# Patient Record
Sex: Female | Born: 2005 | Race: Black or African American | Hispanic: No | Marital: Single | Smoking: Never smoker
Health system: Southern US, Community
[De-identification: ages and names within clinical notes are randomized; demographics above are authoritative.]

## PROBLEM LIST (undated history)

## (undated) DIAGNOSIS — F32A Depression, unspecified: Secondary | ICD-10-CM

## (undated) DIAGNOSIS — F1910 Other psychoactive substance abuse, uncomplicated: Secondary | ICD-10-CM

---
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
## 2023-05-06 ENCOUNTER — Ambulatory Visit: Payer: Medicaid Other

## 2023-06-18 ENCOUNTER — Other Ambulatory Visit: Payer: Self-pay

## 2023-06-18 ENCOUNTER — Emergency Department
Admission: EM | Admit: 2023-06-18 | Discharge: 2023-06-19 | Disposition: A | Discharge status: Other Acute Inpatient Hospital | Payer: Medicaid Other | Attending: Emergency Medicine | Admitting: Emergency Medicine

## 2023-06-18 DIAGNOSIS — F332 Major depressive disorder, recurrent severe without psychotic features: Secondary | ICD-10-CM | POA: Insufficient documentation

## 2023-06-18 DIAGNOSIS — F32A Depression, unspecified: Secondary | ICD-10-CM

## 2023-06-18 DIAGNOSIS — R45851 Suicidal ideations: Secondary | ICD-10-CM | POA: Insufficient documentation

## 2023-06-18 LAB — COMPREHENSIVE METABOLIC PANEL
ALT: 14 U/L (ref 0–44)
AST: 25 U/L (ref 15–41)
Albumin: 4.8 g/dL (ref 3.5–5.0)
Alkaline Phosphatase: 56 U/L (ref 38–126)
Anion gap: 12 (ref 5–15)
BUN: 14 mg/dL (ref 6–20)
CO2: 23 mmol/L (ref 22–32)
Calcium: 9.5 mg/dL (ref 8.9–10.3)
Chloride: 103 mmol/L (ref 98–111)
Creatinine, Ser: 0.8 mg/dL (ref 0.44–1.00)
GFR, Estimated: >60 mL/min (ref >60)
Glucose, Bld: 77 mg/dL (ref 70–99)
Potassium: 3.4 mmol/L — ABNORMAL LOW (ref 3.5–5.1)
Sodium: 138 mmol/L (ref 135–145)
Total Bilirubin: 0.9 mg/dL (ref 0.0–1.2)
Total Protein: 8.2 g/dL — ABNORMAL HIGH (ref 6.5–8.1)

## 2023-06-18 LAB — CBC
HCT: 37.3 % (ref 36.0–46.0)
Hemoglobin: 13.1 g/dL (ref 12.0–15.0)
MCH: 29.6 pg (ref 26.0–34.0)
MCHC: 35.1 g/dL (ref 30.0–36.0)
MCV: 84.4 fL (ref 80.0–100.0)
Platelets: 272 10*3/uL (ref 150–400)
RBC: 4.42 MIL/uL (ref 3.87–5.11)
RDW: 11.9 % (ref 11.5–15.5)
WBC: 6 10*3/uL (ref 4.0–10.5)
nRBC: 0 % (ref 0.0–0.2)

## 2023-06-18 LAB — SALICYLATE LEVEL: Salicylate Lvl: <7 mg/dL — ABNORMAL LOW (ref 7.0–30.0)

## 2023-06-18 LAB — ETHANOL: Alcohol, Ethyl (B): <10 mg/dL (ref <10)

## 2023-06-18 LAB — ACETAMINOPHEN LEVEL: Acetaminophen (Tylenol), Serum: <10 ug/mL — ABNORMAL LOW (ref 10–30)

## 2023-06-18 NOTE — Consult Note (Incomplete)
 [facility] Psychiatric Consult Initial  Patient Name: .Gabrielle Rich  MRN: [id number]  DOB: May 19, 2005  Consult Order details:  Orders (From admission, onward)     Start     Ordered   06/18/23 2216  CONSULT TO CALL ACT TEAM       Ordering Provider: Sharman Cheek, MD  Provider:  (Not yet assigned)  Question:  Reason for Consult?  Answer:  IVC   06/18/23 2215             Mode of Visit: Tele-visit Virtual Statement:TELE PSYCHIATRY ATTESTATION & CONSENT As the provider for this telehealth consult, I attest that I verified the patient's identity using two separate identifiers, introduced myself to the patient, provided my credentials, disclosed my location, and performed this encounter via a HIPAA-compliant, real-time, face-to-face, two-way, interactive audio and video platform and with the full consent and agreement of the patient (or guardian as applicable.) Patient physical location: [facility]. Telehealth provider physical location: home office in state of [place].   Video start time:   Video end time:      Psychiatry Consult Evaluation  Service Date: June 18, 2023 LOS:  LOS: 0 days  Chief Complaint Evaluation for suicidality and acute psychiatric stabilization.  Primary Psychiatric Diagnoses  Suicidal ideation MDD (major depressive disorder), recurrent episode, severe (HCC) Assessment  Makalia Allien Melberg is a 18 y.o. female admitted: Presented to the ED on 06/18/2023 10:32 PM for depression and a suicide attempt. She carries the psychiatric diagnoses of MDD and PTSD.    Her current presentation of *** is most consistent with ***. She meets criteria for *** based on ***.  Current outpatient psychotropic medications include *** and historically she has had a *** response to these medications. She was *** compliant with medications prior to admission as evidenced by ***. On initial examination, patient ***. Please see plan below for detailed recommendations.   Diagnoses:  Active  Hospital problems: Principal Problem:   Suicidal ideation Active Problems:   MDD (major depressive disorder), recurrent episode, severe (HCC)    Plan   ## Psychiatric Medication Recommendations:  Will defer to inpatient psychiatrist   ## Medical Decision Making Capacity: {CHL BH MEDICAL DECISION MAKING CAPACITY:31818}  ## Further Work-up:  -- *** {CHLmacgeneralandspecificworkuprecs:31821} -- most recent EKG on *** had QtC of *** -- Pertinent labwork reviewed earlier this admission includes: ***   ## Disposition:-- {CHLmaccldispo:31820}  ## Behavioral / Environmental: -{CHLmacbehavioralenvironmental2:31847}    ## Safety and Observation Level:  - Based on my clinical evaluation, I estimate the patient to be at *** risk of self harm in the current setting. - At this time, we recommend  {CHL BH SUICIDE OBSERVATION LEVEL:31850}. This decision is based on my review of the chart including patient's history and current presentation, interview of the patient, mental status examination, and consideration of suicide risk including evaluating suicidal ideation, plan, intent, suicidal or self-harm behaviors, risk factors, and protective factors. This judgment is based on our ability to directly address suicide risk, implement suicide prevention strategies, and develop a safety plan while the patient is in the clinical setting. Please contact our team if there is a concern that risk level has changed.  CSSR Risk Category:C-SSRS RISK CATEGORY: No Risk  Suicide Risk Assessment: Patient has following modifiable risk factors for suicide: {CHLmacmodifiablesuicideriskfactors:31822}, which we are addressing by ***. Patient has following non-modifiable or demographic risk factors for suicide: {CHLmacnonmodifiablesuicideriskfactors:31823} Patient has the following protective factors against suicide: {CHLmacprotectivefactors:31824}  Thank you for this consult  request. Recommendations have been  communicated to the primary team.  We will *** at this time.   Jearld Lesch, NP       History of Present Illness  Relevant Aspects of Hospital [facility] Hospital or ED course:31819} Course:  Admitted on 06/18/2023 for ***. They ***.   Patient Report:  Patient states "they bought me here". Patient states that she sat on the train tracks today. Patient states she has been feeling suicidal since middle school. She says she has attempted SA yesterday where she took 10 zanax, but when that didn't work, she decided to lay on the train tracks today.  She say a man came She feels that her mother doesn't care about her.    Psych ROS:  Depression: *** Anxiety:  *** Mania (lifetime and current): *** Psychosis: (lifetime and current): ***  Collateral information:  Contacted *** at *** on ***  ROS   Psychiatric and Social History  Psychiatric History:  Information collected from ***  Prev Dx/Sx: *** Current Psych Provider: *** Home Meds (current): *** Previous Med Trials: *** Therapy: ***  Prior Psych Hospitalization: ***  Prior Self Harm: *** Prior Violence: ***  Family Psych History: *** Family Hx suicide: ***  Social History:  Developmental Hx: *** Educational Hx: *** Occupational Hx: *** Legal Hx: *** Living Situation: *** Spiritual Hx: *** Access to weapons/lethal means: ***   Substance History Alcohol: ***  Type of alcohol *** Last Drink *** Number of drinks per day *** History of alcohol withdrawal seizures *** History of DT's *** Tobacco: *** Illicit drugs: *** Prescription drug abuse: *** Rehab hx: ***  Exam Findings  Physical Exam: *** Vital Signs:  Temp:  [98 F (36.7 C)] 98 F (36.7 C) (02/22 2216) Pulse Rate:  [75] 75 (02/22 2216) Resp:  [14] 14 (02/22 2216) BP: (119)/(88) 119/88 (02/22 2216) SpO2:  [98 %] 98 % (02/22 2216) Blood pressure 119/88, pulse 75, temperature 98 F (36.7 C), temperature source Oral, resp. rate 14, height 5\' 3"  (1.6 m),  last menstrual period 10/26/2022, SpO2 98%, unknown if currently breastfeeding. Body mass index is 17.36 kg/m.  Physical Exam  Mental Status Exam: General Appearance: {Appearance:22683}  Orientation:  {BHH ORIENTATION (PAA):22689}  Memory:  {BHH MEMORY:22881}  Concentration:  {Concentration:21399}  Recall:  {BHH GOOD/FAIR/POOR:22877}  Attention  {BH Attention Span:31825}  Eye Contact:  {BHH EYE CONTACT:22684}  Speech:  {Speech:22685}  Language:  {BHH GOOD/FAIR/POOR:22877}  Volume:  {Volume (PAA):22686}  Mood: ***  Affect:  {Affect (PAA):22687}  Thought Process:  {Thought Process (PAA):22688}  Thought Content:  {Thought Content:22690}  Suicidal Thoughts:  {ST/HT (PAA):22692}  Homicidal Thoughts:  {ST/HT (PAA):22692}  Judgement:  {Judgement (PAA):22694}  Insight:  {Insight (PAA):22695}  Psychomotor Activity:  {Psychomotor (PAA):22696}  Akathisia:  {BHH YES OR NO:22294}  Fund of Knowledge:  {BHH GOOD/FAIR/POOR:22877}      Assets:  {Assets (PAA):22698}  Cognition:  {chl bhh cognition:304700322}  ADL's:  {BHH ZOX'W:96045}  AIMS (if indicated):        Other History   These have been pulled in through the EMR, reviewed, and updated if appropriate.  Family History:  The patient's family history is not on file.  Medical History: No past medical history on file.  Surgical History: Past Surgical History:  Procedure Laterality Date  . LAPAROSCOPIC APPENDECTOMY N/A 11/15/2020   Procedure: APPENDECTOMY LAPAROSCOPIC;  Surgeon: Leonia Corona, MD;  Location: [facility] OR;  Service: Pediatrics;  Laterality: N/A;     Medications:  No current  facility-administered medications for this encounter.  Current Outpatient Medications:  .  medroxyPROGESTERone (DEPO-PROVERA) 150 MG/ML injection, Inject 150 mg into the muscle every 3 (three) months., Disp: , Rfl:  .  acetaminophen (TYLENOL) 500 MG tablet, Take 1 tablet (500 mg total) by mouth every 6 (six) hours as needed for mild pain or  moderate pain (>101.5 F). (Patient not taking: Reported on 06/18/2023), Disp: 30 tablet, Rfl: 0 .  ibuprofen (ADVIL) 200 MG tablet, Take 1 tablet (200 mg total) by mouth every 6 (six) hours as needed for mild pain or moderate pain. (Patient not taking: Reported on 06/18/2023), Disp: 30 tablet, Rfl: 0  Allergies: No Known Allergies  Romar Woodrick Damaris Hippo, NP

## 2023-06-18 NOTE — Consult Note (Addendum)
 [facility] Psychiatric Consult Initial  Patient Name: .Gabrielle Rich  MRN: [id number]  DOB: November 26, 2005  Consult Order details:  Orders (From admission, onward)     Start     Ordered   06/18/23 2216  CONSULT TO CALL ACT TEAM       Ordering Provider: Sharman Cheek, MD  Provider:  (Not yet assigned)  Question:  Reason for Consult?  Answer:  IVC   06/18/23 2215             Mode of Visit: Tele-visit Virtual Statement:TELE PSYCHIATRY ATTESTATION & CONSENT As the provider for this telehealth consult, I attest that I verified the patient's identity using two separate identifiers, introduced myself to the patient, provided my credentials, disclosed my location, and performed this encounter via a HIPAA-compliant, real-time, face-to-face, two-way, interactive audio and video platform and with the full consent and agreement of the patient (or guardian as applicable.) Patient physical location: [facility] ER. Telehealth provider physical location: home office in state of [place].   Video start time:   Video end time:      Psychiatry Consult Evaluation  Service Date: June 18, 2023 LOS:  LOS: 0 days  Chief Complaint Evaluation for suicidality and acute psychiatric stabilization.  Primary Psychiatric Diagnoses  Suicidal ideation MDD (major depressive disorder), recurrent episode, severe (HCC) Assessment  The patient presents with acute suicidality in the context of severe depression and significant psychosocial stressors. Her presentation is most consistent with a severe major depressive episode with high suicide risk. She meets criteria for inpatient psychiatric admission based on persistent suicidal ideation, recent suicide attempt, and lack of adequate social support.  Diagnoses:  Active Hospital problems: Principal Problem:   Suicidal ideation Active Problems:   MDD (major depressive disorder), recurrent episode, severe (HCC)    Plan   ## Psychiatric Medication Recommendations:   Will defer to inpatient psychiatrist   ## Medical Decision Making Capacity: Not specifically addressed in this encounter  ## Further Work-up:   Kassity Woodson was admitted to [facility] ER Suicidal ideation, crisis management, and stabilization. Routine labs ordered, which include  Lab Orders         Comprehensive metabolic panel         Ethanol         Salicylate level         Acetaminophen level         cbc         Urine Drug Screen, Qualitative         POC urine preg, ED    Medication Management: Medications started  Will maintain observation checks every 15 minutes for safety. Psychosocial education regarding relapse prevention and self-care; social and communication  Social work will consult with family for collateral information and discuss discharge and follow up plan.  ## Disposition:-- We recommend inpatient psychiatric hospitalization when medically cleared. Patient is under voluntary admission status at this time; please IVC if attempts to leave hospital.  ## Behavioral / Environmental: - No specific recommendations at this time.     ## Safety and Observation Level:  - Based on my clinical evaluation, I estimate the patient to be at moderate  risk of self harm in the current setting. - At this time, we recommend  routine. This decision is based on my review of the chart including patient's history and current presentation, interview of the patient, mental status examination, and consideration of suicide risk including evaluating suicidal ideation, plan, intent, suicidal or self-harm behaviors, risk factors,  and protective factors. This judgment is based on our ability to directly address suicide risk, implement suicide prevention strategies, and develop a safety plan while the patient is in the clinical setting. Please contact our team if there is a concern that risk level has changed.  CSSR Risk Category:C-SSRS RISK CATEGORY: No Risk  Suicide Risk Assessment: Patient  has following modifiable risk factors for suicide: active suicidal ideation, untreated depression, and recklessness, which we are addressing by recommending hospitalization. Patient has following non-modifiable or demographic risk factors for suicide: history of suicide attempt and history of self harm behavior Patient has the following protective factors against suicide: Minor children in the home  Thank you for this consult request. Recommendations have been communicated to the primary team.  We will recommend inpatient hospitalization at this time.   Jearld Lesch, NP       History of Present Illness  Relevant Aspects of Hospital ED Course:  Admitted on 06/18/2023 for Suicide attempt and MDD.   Gabrielle Rich is a 18 year old female who presented to the Emergency Department with symptoms of depression and suicidality following a suicide attempt. She has a known psychiatric history of Major Depressive Disorder (MDD) and Post-Traumatic Stress Disorder (PTSD). The patient reports chronic suicidal ideation since middle school.  On 06/17/2023, she attempted to overdose by ingesting 10 Xanax tablets. When this attempt failed, she proceeded to lie on train tracks with the intention of being struck by a train. A passerby intervened, convincing her to leave the tracks moments before an oncoming train arrived. The patient expressed regret that she was saved.  She reports recent stressors, including a transition in living arrangements. She was living with her grandmother until one month ago and has since moved in with her mother. She describes her relationship with her mother as strained, stating that when she disclosed her suicidal thoughts, her mother responded, "That's on you."  Psych ROS:  Depression: current Anxiety:  current Mania (lifetime and current): unknown Psychosis: (lifetime and current): unknown   Review of Systems  Psychiatric/Behavioral:  Positive for depression and suicidal  ideas. Negative for hallucinations and substance abuse. The patient is nervous/anxious and has insomnia.   All other systems reviewed and are negative.    Psychiatric and Social History  Psychiatric History:  Diagnoses: Major Depressive Disorder (MDD), Post-Traumatic Stress Disorder (PTSD)  Prior Suicide Attempts: Yes, multiple, including the most recent attempt.  Previous Psychiatric Hospitalizations: Unknown  Outpatient Psychiatric Care: Unknown  History of Self-Injurious Behavior: Unknown    Exam Findings  Physical Exam:  Vital Signs:  Temp:  [98 F (36.7 C)] 98 F (36.7 C) (02/22 2216) Pulse Rate:  [75] 75 (02/22 2216) Resp:  [14] 14 (02/22 2216) BP: (119)/(88) 119/88 (02/22 2216) SpO2:  [98 %] 98 % (02/22 2216) Blood pressure 119/88, pulse 75, temperature 98 F (36.7 C), temperature source Oral, resp. rate 14, height 5\' 3"  (1.6 m), last menstrual period 10/26/2022, SpO2 98%, unknown if currently breastfeeding. Body mass index is 17.36 kg/m.  Physical Exam Vitals and nursing note reviewed.  HENT:     Head: Normocephalic and atraumatic.     Nose: Nose normal.     Mouth/Throat:     Mouth: Mucous membranes are dry.  Eyes:     Pupils: Pupils are equal, round, and reactive to light.  Pulmonary:     Effort: Pulmonary effort is normal.  Musculoskeletal:        General: Normal range of motion.  Cervical back: Normal range of motion.  Skin:    General: Skin is dry.  Neurological:     Mental Status: She is alert and oriented to person, place, and time.  Psychiatric:        Attention and Perception: Attention and perception normal.        Mood and Affect: Mood is depressed. Affect is flat and tearful.        Speech: Speech normal.        Behavior: Behavior normal. Behavior is cooperative.        Thought Content: Thought content includes suicidal ideation. Thought content includes suicidal plan.        Cognition and Memory: Cognition and memory normal.         Judgment: Judgment is impulsive.     Mental Status Exam: General Appearance: Casual  Orientation:  Full (Time, Place, and Person)  Memory:  Immediate;   Fair Recent;   Fair Remote;   Fair  Concentration:  Concentration: Fair and Attention Span: Fair  Recall:  Fair  Attention  Fair  Eye Contact:  Good  Speech:  Normal Rate  Language:  Fair  Volume:  Decreased  Mood: depressed  Affect:  Congruent  Thought Process:  Coherent  Thought Content:  WDL  Suicidal Thoughts:  Yes.  with intent/plan  Homicidal Thoughts:  No  Judgement:  Poor  Insight:  Lacking  Psychomotor Activity:  Normal  Akathisia:  NA  Fund of Knowledge:  Fair      Assets:  Engineer, maintenance Physical Health  Cognition:  WNL  ADL's:  Intact  AIMS (if indicated):        Other History   These have been pulled in through the EMR, reviewed, and updated if appropriate.  Family History:  The patient's family history is not on file.  Medical History: No past medical history on file.  Surgical History: Past Surgical History:  Procedure Laterality Date   LAPAROSCOPIC APPENDECTOMY N/A 11/15/2020   Procedure: APPENDECTOMY LAPAROSCOPIC;  Surgeon: Leonia Corona, MD;  Location: [facility] OR;  Service: Pediatrics;  Laterality: N/A;     Medications:  No current facility-administered medications for this encounter.  Current Outpatient Medications:    medroxyPROGESTERone (DEPO-PROVERA) 150 MG/ML injection, Inject 150 mg into the muscle every 3 (three) months., Disp: , Rfl:    acetaminophen (TYLENOL) 500 MG tablet, Take 1 tablet (500 mg total) by mouth every 6 (six) hours as needed for mild pain or moderate pain (>101.5 F). (Patient not taking: Reported on 06/18/2023), Disp: 30 tablet, Rfl: 0   ibuprofen (ADVIL) 200 MG tablet, Take 1 tablet (200 mg total) by mouth every 6 (six) hours as needed for mild pain or moderate pain. (Patient not taking: Reported on 06/18/2023), Disp: 30 tablet, Rfl: 0  Allergies: No  Known Allergies  Khoen Genet Damaris Hippo, NP

## 2023-06-18 NOTE — BH Assessment (Addendum)
 Comprehensive Clinical Assessment (CCA) Note  06/18/2023 Gabrielle Rich [id number] Recommendations for Services/Supports/Treatments: Psych NP Rashaun D. determined pt. meets psychiatric inpatient criteria. Gabrielle Rich is a 18 year old, English speaking, Black female with an unknown psych hx. Per triage note: Pt to ed from home via [employer] under IVC for SI. Pt allegedly took 10 Xanax last night in attempts to kill herself and when that didn't work she wanted to go lay across the railroad tracks. A stranger called 911 when they saw her lying on the train tracks earlier today. Pt was taken to [facility] for treatment but they decided to send her here instead of keeping here there.  Pt presented with, "It's really in my head" when asked what brought her to the hospital. Upon assessment, pt. was initially guarded. Pt eventually endorsed feeling depressed, hopeless, and stressed. Pt reported that she'd ingested 10 Xanax pills she'd gotten from a friend 06/17/23 due to no longer wanting to live. Pt reported that she'd tried to lay on the train tracks today due to the OD attempt being unsuccessful. Pt admitted to suicidal intent or actually wanting to die, explaining that she was disappointed that a good Samaritan saw her on the tracks. Pt admitted that her main stressor is feeling that her mother picks her boyfriend over her. Pt reported that she recently moved with her mother and boyfriend about 1 month ago. Pt explained that he mother nurtures her 3 younger brothers; however, she does not seem to care about her. Pt reported that she'd gotten overwhelmed earlier due to witnessing an argument between her mother and her boyfriend. Pt reported that she has always been in her grandmother's care during her formative years. Pt reported that her mother is a substance abuser. Pt became tearful while providing her history. Pt reported having sleep and appetite disturbance. Pt explained that she does not receive any services. The pt. had  lacking insight and dangerous judgement. Pt did not appear to be responding to internal or external stimuli. Pt presented with soft speech and had linear thought processes. Pt avoided eye contact. Pt presented with a depressed mood; affect was tearful. Pt denied current HI/AV/H.   Risk Factors: Current ideation Impulsivity, agitation,  poor self-control, hopelessness, and previous suicide ideation or attempts  Protective Factors: N/A   Chief Complaint:  Chief Complaint  Patient presents with   Psychiatric Evaluation   Visit Diagnosis: MDD    CCA Screening, Triage and Referral (STR)  Patient Reported Information How did you hear about [place]? No data recorded Referral name: No data recorded Referral phone number: No data recorded  Whom do you see for routine medical problems? No data recorded Practice/Facility Name: No data recorded Practice/Facility Phone Number: No data recorded Name of Contact: No data recorded Contact Number: No data recorded Contact Fax Number: No data recorded Prescriber Name: No data recorded Prescriber Address (if known): No data recorded  What Is the Reason for Your Visit/Call Today? No data recorded How Long Has This Been Causing You Problems? No data recorded What Do You Feel Would Help You the Most Today? No data recorded  Have You Recently Been in Any Inpatient Treatment (Hospital/Detox/Crisis Center/28-Day Program)? No data recorded Name/Location of Program/Hospital:No data recorded How Long Were You There? No data recorded When Were You Discharged? No data recorded  Have You Ever Received Services From [facility] Before? No data recorded Who Do You See at [facility]? No data recorded  Have You Recently Had Any Thoughts About Hurting Yourself?  No data recorded Are You Planning to Commit Suicide/Harm Yourself At This time? No data recorded  Have you Recently Had Thoughts About Hurting Someone Karolee Ohs? No data recorded Explanation: No data  recorded  Have You Used Any Alcohol or Drugs in the Past 24 Hours? No data recorded How Long Ago Did You Use Drugs or Alcohol? No data recorded What Did You Use and How Much? No data recorded  Do You Currently Have a Therapist/Psychiatrist? No data recorded Name of Therapist/Psychiatrist: No data recorded  Have You Been Recently Discharged From Any Office Practice or Programs? No data recorded Explanation of Discharge From Practice/Program: No data recorded    CCA Screening Triage Referral Assessment Type of Contact: No data recorded Is this Initial or Reassessment? No data recorded Date Telepsych consult ordered in [facility]:  No data recorded Time Telepsych consult ordered in [facility]:  No data recorded  Patient Reported Information Reviewed? No data recorded Patient Left Without Being Seen? No data recorded Reason for Not Completing Assessment: No data recorded  Collateral Involvement: No data recorded  Does Patient Have a Court Appointed Legal Guardian? No data recorded Name and Contact of Legal Guardian: No data recorded If Minor and Not Living with Parent(s), Who has Custody? No data recorded Is CPS involved or ever been involved? No data recorded Is APS involved or ever been involved? No data recorded  Patient Determined To Be At Risk for Harm To Self or Others Based on Review of Patient Reported Information or Presenting Complaint? No data recorded Method: No data recorded Availability of Means: No data recorded Intent: No data recorded Notification Required: No data recorded Additional Information for Danger to Others Potential: No data recorded Additional Comments for Danger to Others Potential: No data recorded Are There Guns or Other Weapons in Your Home? No data recorded Types of Guns/Weapons: No data recorded Are These Weapons Safely Secured?                            No data recorded Who Could Verify You Are Able To Have These Secured: No data recorded Do You Have any  Outstanding Charges, Pending Court Dates, Parole/Probation? No data recorded Contacted To Inform of Risk of Harm To Self or Others: No data recorded  Location of Assessment: No data recorded  Does Patient Present under Involuntary Commitment? No data recorded IVC Papers Initial File Date: No data recorded  [place] of Residence: No data recorded  Patient Currently Receiving the Following Services: No data recorded  Determination of Need: No data recorded  Options For Referral: No data recorded    CCA Biopsychosocial Intake/Chief Complaint:  No data recorded Current Symptoms/Problems: No data recorded  Patient Reported Schizophrenia/Schizoaffective Diagnosis in Past: No data recorded  Strengths: No data recorded Preferences: No data recorded Abilities: No data recorded  Type of Services Patient Feels are Needed: No data recorded  Initial Clinical Notes/Concerns: No data recorded  Mental Health Symptoms Depression:  No data recorded  Duration of Depressive symptoms: No data recorded  Mania:  No data recorded  Anxiety:   No data recorded  Psychosis:  No data recorded  Duration of Psychotic symptoms: No data recorded  Trauma:  No data recorded  Obsessions:  No data recorded  Compulsions:  No data recorded  Inattention:  No data recorded  Hyperactivity/Impulsivity:  No data recorded  Oppositional/Defiant Behaviors:  No data recorded  Emotional Irregularity:  No data recorded  Other Mood/Personality  Symptoms:  No data recorded   Mental Status Exam Appearance and self-care  Stature:  No data recorded  Weight:  No data recorded  Clothing:  No data recorded  Grooming:  No data recorded  Cosmetic use:  No data recorded  Posture/gait:  No data recorded  Motor activity:  No data recorded  Sensorium  Attention:  No data recorded  Concentration:  No data recorded  Orientation:  No data recorded  Recall/memory:  No data recorded  Affect and Mood  Affect:  No data  recorded  Mood:  No data recorded  Relating  Eye contact:  No data recorded  Facial expression:  No data recorded  Attitude toward examiner:  No data recorded  Thought and Language  Speech flow: No data recorded  Thought content:  No data recorded  Preoccupation:  No data recorded  Hallucinations:  No data recorded  Organization:  No data recorded  [employer] of Knowledge:  No data recorded  Intelligence:  No data recorded  Abstraction:  No data recorded  Judgement:  No data recorded  Reality Testing:  No data recorded  Insight:  No data recorded  Decision Making:  No data recorded  Social Functioning  Social Maturity:  No data recorded  Social Judgement:  No data recorded  Stress  Stressors:  No data recorded  Coping Ability:  No data recorded  Skill Deficits:  No data recorded  Supports:  No data recorded    Religion:    Leisure/Recreation:    Exercise/Diet:     CCA Employment/Education Employment/Work Situation:    Education:     CCA Family/Childhood History Family and Relationship History:    Childhood History:     Child/Adolescent Assessment:     CCA Substance Use Alcohol/Drug Use:                           ASAM's:  Six Dimensions of Multidimensional Assessment  Dimension 1:  Acute Intoxication and/or Withdrawal Potential:      Dimension 2:  Biomedical Conditions and Complications:      Dimension 3:  Emotional, Behavioral, or Cognitive Conditions and Complications:     Dimension 4:  Readiness to Change:     Dimension 5:  Relapse, Continued use, or Continued Problem Potential:     Dimension 6:  Recovery/Living Environment:     ASAM Severity Score:    ASAM Recommended Level of Treatment:     Substance use Disorder (SUD)    Recommendations for Services/Supports/Treatments:    DSM5 Diagnoses: Patient Active Problem List   Diagnosis Date Noted   Acute appendicitis 11/15/2020    Patient Centered  Plan: Patient is on the following Treatment Plan(s):  Depression  @[email or web address] @  [address] [place], LCAS

## 2023-06-18 NOTE — ED Provider Notes (Signed)
 [facility] Provider Note    Event Date/Time   First MD Initiated Contact with Patient 06/18/23 2304     (approximate)   History   Psychiatric Evaluation   HPI  Level V caveat: Limited by agitation  Tonimarie Gritz is a 18 y.o. female brought to the ED via [employer] under IVC for depression with suicidal ideation.  Patient reportedly tried to overdose by taking 10 Xanax last night in an attempt to kill herself.  When that did not work she laid across the train tracks for a stranger saw her and called 911.  She was taken to [facility] who placed her under IVC and referred her to the ED for further evaluation.  Rest of history is unobtainable secondary to patient upset and agitated.     Past Medical History  No past medical history on file.   Active Problem List   Patient Active Problem List   Diagnosis Date Noted   Acute appendicitis 11/15/2020     Past Surgical History   Past Surgical History:  Procedure Laterality Date   LAPAROSCOPIC APPENDECTOMY N/A 11/15/2020   Procedure: APPENDECTOMY LAPAROSCOPIC;  Surgeon: Leonia Corona, MD;  Location: [facility] OR;  Service: Pediatrics;  Laterality: N/A;     Home Medications   Prior to Admission medications   Medication Sig Start Date End Date Taking? Authorizing Provider  medroxyPROGESTERone (DEPO-PROVERA) 150 MG/ML injection Inject 150 mg into the muscle every 3 (three) months.   Yes [provider]  acetaminophen (TYLENOL) 500 MG tablet Take 1 tablet (500 mg total) by mouth every 6 (six) hours as needed for mild pain or moderate pain (>101.5 F). Patient not taking: Reported on 06/18/2023 11/16/20   Leonia Corona, MD  ibuprofen (ADVIL) 200 MG tablet Take 1 tablet (200 mg total) by mouth every 6 (six) hours as needed for mild pain or moderate pain. Patient not taking: Reported on 06/18/2023 11/16/20   Leonia Corona, MD     Allergies  Patient has no known allergies.   Family History  No family  history on file.   Physical Exam  Triage Vital Signs: ED Triage Vitals  Encounter Vitals Group     BP 06/18/23 2216 119/88     Systolic BP Percentile --      Diastolic BP Percentile --      Pulse Rate 06/18/23 2216 75     Resp 06/18/23 2216 14     Temp 06/18/23 2216 98 F (36.7 C)     Temp Source 06/18/23 2216 Oral     SpO2 06/18/23 2216 98 %     Weight --      Height 06/18/23 2214 5\' 3"  (1.6 m)     Head Circumference --      Peak Flow --      Pain Score 06/18/23 2213 0     Pain Loc --      Pain Education --      Exclude from Growth Chart --     Updated Vital Signs: BP 119/88   Pulse 75   Temp 98 F (36.7 C) (Oral)   Resp 14   Ht 5\' 3"  (1.6 m)   LMP 10/26/2022 (Approximate)   SpO2 98%   Breastfeeding Unknown   BMI 17.36 kg/m    General: Awake, moderate distress.  CV:  RRR.  Good peripheral perfusion.  Resp:  Normal effort.  CTAB. Abd:  No distention.  Other:  Tearful, upset, agitated.   ED Results /  Procedures / Treatments  Labs (all labs ordered are listed, but only abnormal results are displayed) Labs Reviewed  COMPREHENSIVE METABOLIC PANEL - Abnormal; Notable for the following components:      Result Value   Potassium 3.4 (*)    Total Protein 8.2 (*)    All other components within normal limits  SALICYLATE LEVEL - Abnormal; Notable for the following components:   Salicylate Lvl <7.0 (*)    All other components within normal limits  ACETAMINOPHEN LEVEL - Abnormal; Notable for the following components:   Acetaminophen (Tylenol), Serum <10 (*)    All other components within normal limits  ETHANOL  CBC  URINE DRUG SCREEN, QUALITATIVE ([facility] ONLY)  POC URINE PREG, ED     EKG  None   RADIOLOGY None   Official radiology report(s): No results found.   PROCEDURES:  Critical Care performed: No  Procedures   MEDICATIONS ORDERED IN ED: Medications - No data to display   IMPRESSION / MDM / ASSESSMENT AND PLAN / ED COURSE  I reviewed  the triage vital signs and the nursing notes.                             18 year old female brought to the ED under IVC for suicidal ideation.  Laboratory results unremarkable.  She is medically cleared for psychiatric evaluation and disposition.  Patient's presentation is most consistent with acute complicated illness / injury requiring diagnostic workup.  The patient has been placed in psychiatric observation due to the need to provide a safe environment for the patient while obtaining psychiatric consultation and evaluation, as well as ongoing medical and medication management to treat the patient's condition.  The patient has been placed under full IVC at this time.       FINAL CLINICAL IMPRESSION(S) / ED DIAGNOSES   Final diagnoses:  Depression, unspecified depression type     Rx / DC Orders   ED Discharge Orders     None        Note:  This document was prepared using Dragon voice recognition software and may include unintentional dictation errors.   Irean Hong, MD 06/19/23 [phone]

## 2023-06-18 NOTE — ED Notes (Addendum)
 Pt dressed out :  [place] bonnet Tan hoodie  Black pants Black socks Black shoes Black tank top Pokemon boxers  Cell phone not broken  Hair ties I yellow metal necklace 1 silver earring  Two yellow metal earrings

## 2023-06-18 NOTE — ED Triage Notes (Addendum)
 Pt to ed from home via [employer] under IVC for SI. Pt allegedly took 10 xanax last night in attempts to kill herself and when that didn't work she wanted to go lay across the railroad tracks. A stranger called 911 when they saw her lying on the train tracks earlier today. Pt was taken to [facility] for treatment but they decided to send her here instead of keeping here there.   NOTE: The IVC paperwork is not correct per [employer] and is NOT active. [facility] completed it and filled out the wrong date.

## 2023-06-19 ENCOUNTER — Inpatient Hospital Stay
Admission: AD | Admit: 2023-06-19 | Discharge: 2023-06-23 | DRG: 885 | Disposition: A | Discharge status: Home / Self Care | Payer: Self-pay | Source: Intra-hospital | Attending: Psychiatry | Admitting: Psychiatry

## 2023-06-19 ENCOUNTER — Encounter: Payer: Self-pay | Admitting: Psychiatric/Mental Health

## 2023-06-19 DIAGNOSIS — F1290 Cannabis use, unspecified, uncomplicated: Secondary | ICD-10-CM | POA: Diagnosis present

## 2023-06-19 DIAGNOSIS — Z9152 Personal history of nonsuicidal self-harm: Secondary | ICD-10-CM

## 2023-06-19 DIAGNOSIS — F17290 Nicotine dependence, other tobacco product, uncomplicated: Secondary | ICD-10-CM | POA: Diagnosis present

## 2023-06-19 DIAGNOSIS — Z793 Long term (current) use of hormonal contraceptives: Secondary | ICD-10-CM | POA: Diagnosis not present

## 2023-06-19 DIAGNOSIS — T424X2D Poisoning by benzodiazepines, intentional self-harm, subsequent encounter: Secondary | ICD-10-CM | POA: Diagnosis not present

## 2023-06-19 DIAGNOSIS — F329 Major depressive disorder, single episode, unspecified: Principal | ICD-10-CM | POA: Diagnosis present

## 2023-06-19 DIAGNOSIS — F4310 Post-traumatic stress disorder, unspecified: Secondary | ICD-10-CM | POA: Diagnosis present

## 2023-06-19 DIAGNOSIS — F419 Anxiety disorder, unspecified: Secondary | ICD-10-CM | POA: Diagnosis present

## 2023-06-19 DIAGNOSIS — F332 Major depressive disorder, recurrent severe without psychotic features: Secondary | ICD-10-CM | POA: Diagnosis present

## 2023-06-19 DIAGNOSIS — F339 Major depressive disorder, recurrent, unspecified: Secondary | ICD-10-CM | POA: Diagnosis not present

## 2023-06-19 HISTORY — DX: Depression, unspecified: F32.A

## 2023-06-19 HISTORY — DX: Other psychoactive substance abuse, uncomplicated: F19.10

## 2023-06-19 LAB — URINE DRUG SCREEN, QUALITATIVE (ARMC ONLY)
Amphetamines, Ur Screen: NOT DETECTED
Barbiturates, Ur Screen: NOT DETECTED
Benzodiazepine, Ur Scrn: POSITIVE — AB
Cannabinoid 50 Ng, Ur ~~LOC~~: POSITIVE — AB
Cocaine Metabolite,Ur ~~LOC~~: NOT DETECTED
MDMA (Ecstasy)Ur Screen: NOT DETECTED
Methadone Scn, Ur: NOT DETECTED
Opiate, Ur Screen: NOT DETECTED
Phencyclidine (PCP) Ur S: NOT DETECTED
Tricyclic, Ur Screen: NOT DETECTED

## 2023-06-19 LAB — PREGNANCY, URINE: Preg Test, Ur: NEGATIVE

## 2023-06-19 MED ORDER — ACETAMINOPHEN 325 MG PO TABS: 650.0000 mg | ORAL_TABLET | Freq: Four times a day (QID) | ORAL | Status: DC | PRN

## 2023-06-19 MED ORDER — ALUM & MAG HYDROXIDE-SIMETH 200-200-20 MG/5ML PO SUSP: 30.0000 mL | ORAL | Status: DC | PRN

## 2023-06-19 MED ORDER — DIPHENHYDRAMINE HCL 25 MG PO CAPS: 50.0000 mg | ORAL_CAPSULE | Freq: Three times a day (TID) | ORAL | Status: DC | PRN

## 2023-06-19 MED ORDER — TRAZODONE HCL 50 MG PO TABS
50.0000 mg | ORAL_TABLET | Freq: Every day | ORAL | Status: DC
  Administered 2023-06-19 – 2023-06-22 (×4): 50 mg via ORAL
  Filled 2023-06-19 (×4): qty 1

## 2023-06-19 MED ORDER — HALOPERIDOL 5 MG PO TABS: 5.0000 mg | ORAL_TABLET | Freq: Three times a day (TID) | ORAL | Status: DC | PRN

## 2023-06-19 MED ORDER — LORAZEPAM 2 MG/ML IJ SOLN: 2.0000 mg | Freq: Three times a day (TID) | INTRAMUSCULAR | Status: DC | PRN

## 2023-06-19 MED ORDER — DIPHENHYDRAMINE HCL 50 MG/ML IJ SOLN: 50.0000 mg | Freq: Three times a day (TID) | INTRAMUSCULAR | Status: DC | PRN

## 2023-06-19 MED ORDER — LORAZEPAM 1 MG PO TABS
1.0000 mg | ORAL_TABLET | Freq: Once | ORAL | Status: AC | PRN
  Administered 2023-06-19: 1 mg via ORAL
  Filled 2023-06-19: qty 1

## 2023-06-19 MED ORDER — HALOPERIDOL LACTATE 5 MG/ML IJ SOLN: 10.0000 mg | Freq: Three times a day (TID) | INTRAMUSCULAR | Status: DC | PRN

## 2023-06-19 MED ORDER — HALOPERIDOL LACTATE 5 MG/ML IJ SOLN: 5.0000 mg | Freq: Three times a day (TID) | INTRAMUSCULAR | Status: DC | PRN

## 2023-06-19 MED ORDER — LORAZEPAM 2 MG/ML IJ SOLN: 1.0000 mg | Freq: Once | INTRAMUSCULAR | Status: AC | PRN

## 2023-06-19 MED ORDER — MAGNESIUM HYDROXIDE 400 MG/5ML PO SUSP: 30.0000 mL | Freq: Every day | ORAL | Status: DC | PRN

## 2023-06-19 MED ORDER — MELATONIN 5 MG PO TABS
2.5000 mg | ORAL_TABLET | Freq: Every day | ORAL | Status: DC
  Administered 2023-06-19 – 2023-06-22 (×4): 2.5 mg via ORAL
  Filled 2023-06-19 (×4): qty 1

## 2023-06-19 MED ORDER — TRAZODONE HCL 50 MG PO TABS: 50.0000 mg | ORAL_TABLET | Freq: Every evening | ORAL | Status: DC | PRN

## 2023-06-19 NOTE — Plan of Care (Signed)
  Problem: Education: Goal: Knowledge of [facility] Education information/materials will improve Outcome: Progressing Goal: Emotional status will improve Outcome: Progressing Goal: Mental status will improve Outcome: Progressing Goal: Verbalization of understanding the information provided will improve Outcome: Progressing  Patient compliant with medications denies SI/HI/A/VH at present and verbally contracts for safety. Patient stated her goal is to get better and go home for her brother birthday she stated she is excited about going back to school to complete her GED. She is pleasant and happy affect support and encouragement provided as needed.

## 2023-06-19 NOTE — ED Notes (Signed)
 Pt taking shower. Pt was given hygiene items and the following, 1 clean top, 1 clean bottom, with 1 pair of disposable underwear.  Pt changed out into clean clothing.  Staff disposed of all shower supplies.

## 2023-06-19 NOTE — Tx Team (Signed)
 Initial Treatment Plan 06/19/2023 3:27 PM [name] QIO:[id number]    PATIENT STRESSORS: Educational concerns   Other: Not having job or finishing school. "Feels like Im not doing enough in life"     PATIENT STRENGTHS: Ability for insight  Supportive family/friends    PATIENT IDENTIFIED PROBLEMS: Unemployed                      DISCHARGE CRITERIA:  Ability to meet basic life and health needs Improved stabilization in mood, thinking, and/or behavior Safe-care adequate arrangements made  PRELIMINARY DISCHARGE PLAN: Return to previous living arrangement  PATIENT/FAMILY INVOLVEMENT: This treatment plan has been presented to and reviewed with the patient, Gabrielle Rich,  The patient and family have been given the opportunity to ask questions and make suggestions.  Earleen Newport, RN 06/19/2023, 3:27 PM

## 2023-06-19 NOTE — ED Notes (Signed)
 Pt awoke and pressed call button.  RN entered room and pt was crying.  Pt requested to be able to use phone when she can, RN told pt she can make a call at 0900.  Pt continued crying, RN asked pt if she would like some medication for anxiety and pt said yes.

## 2023-06-19 NOTE — ED Notes (Signed)
 IVC /moved to BHU 6 /Recommend inpatient psych when medically cleared

## 2023-06-19 NOTE — Progress Notes (Signed)
   06/19/23 1500  Psych Admission Type (Psych Patients Only)  Admission Status Involuntary  Psychosocial Assessment  Patient Complaints Worthlessness;Depression;Insomnia (pt states she feels like he not doing enough in life. sad she was not able to finish school)  Eye Contact Brief  Facial Expression Sad  Affect Sad  Speech Logical/coherent  Interaction Minimal  Motor Activity Other (Comment) (WNL)  Appearance/Hygiene Unremarkable;In scrubs  Behavior Characteristics Fidgety  Mood Sad;Depressed  Thought Process  Coherency WDL  Content WDL  Delusions WDL  Perception WDL  Hallucination None reported or observed  Judgment Limited  Confusion WDL  Danger to Self  Current suicidal ideation? Denies  Self-Injurious Behavior No self-injurious ideation or behavior indicators observed or expressed   Agreement Not to Harm Self Yes  Description of Agreement verbal  Danger to Others  Danger to Others None reported or observed

## 2023-06-19 NOTE — Group Note (Signed)
 Date:  06/19/2023 Time:  4:39 PM  Group Topic/Focus:  Activity Group: The focus of the group is to promote activity for the patients and to encourage patients to go outside to the courtyard and get some fresh air and some exercise.    Participation Level:  Did Not Attend   Gabrielle Rich 06/19/2023, 4:39 PM

## 2023-06-19 NOTE — ED Provider Notes (Signed)
 Emergency Medicine Observation Re-evaluation Note  Gabrielle Rich is a 18 y.o. female, seen on rounds today.  Pt initially presented to the ED for complaints of Psychiatric Evaluation  Currently, the patient is no acute distress. Pt resting in bed   Physical Exam  Blood pressure 119/88, pulse 75, temperature 98 F (36.7 C), temperature source Oral, resp. rate 14, height 5\' 3"  (1.6 m), last menstrual period 10/26/2022, SpO2 98%, unknown if currently breastfeeding.  Physical Exam General: No apparent distress Pulm: Normal WOB Psych: resting     ED Course / MDM     I have reviewed the labs performed to date as well as medications administered while in observation.  Recent changes in the last 24 hours include ativan for anxiety.   Plan   Current plan is to continue to wait for psych plan/placement if felt warranted  Patient is under full IVC at this time.   Concha Se, MD 06/19/23 [phone]

## 2023-06-19 NOTE — ED Notes (Signed)
 Pt in hallway yelling and saying  "I ain't staying here and ya'll mother fuckers ain't keeping me here", pt. Redirected to room and continues yelling and pacing in room, stating " I am getting ready to get the fuck out of here"

## 2023-06-19 NOTE — ED Notes (Signed)
 Report given to [facility] RN, pt calm, cooperative, and agreeable to inpatient tx.  Pt continues to be tearful. Belongings given to security for transport.  Pt currently denying SI/HI, stated no A/V hallucinations. Vitals WNL, transported via wheelchair

## 2023-06-19 NOTE — Plan of Care (Addendum)
 Pt arrived from [facility] ED tried to O/D on xanax and laid on train track. States she feels like she she is not doing enough in life and regrets not finishing school. Pt reports she has a hx of "cutting my arms and taking a lighter and burning myself but has never tried to kill myself" pt reports having trouble sleeping past couple days. Pt report she as close relationship with her grandparents. Pt expressed that she wanted to work on her self esteem and coping mechanisms while here. Pt denies SI HI AVH. Skin assessment and body search done. Oriented to unit.    Problem: Education: Goal: Utilization of techniques to improve thought processes will improve Outcome: Not Progressing Goal: Knowledge of the prescribed therapeutic regimen will improve Outcome: Not Progressing   Problem: Coping: Goal: Coping ability will improve Outcome: Not Progressing Goal: Will verbalize feelings Outcome: Not Progressing   Problem: Self-Concept: Goal: Will verbalize positive feelings about self Outcome: Not Progressing Goal: Level of anxiety will decrease Outcome: Not Progressing

## 2023-06-19 NOTE — ED Notes (Signed)
 Up until this point, pt was pacing, yelling, hitting the doors with her hands.  Pt has just layed in her bed and covered herself with a blanket.

## 2023-06-19 NOTE — Consult Note (Signed)
  Admission orders complete.

## 2023-06-19 NOTE — Group Note (Signed)
 Date:  06/19/2023 Time:  3:12 PM  Group Topic/Focus:  Goals Group:   The focus of this group is to help patients establish daily goals to achieve during treatment and discuss how the patient can incorporate goal setting into their daily lives to aide in recovery.    Participation Level:  Did Not Attend   Gabrielle Rich 06/19/2023, 3:12 PM

## 2023-06-20 ENCOUNTER — Encounter: Payer: Self-pay | Admitting: Psychiatric/Mental Health

## 2023-06-20 DIAGNOSIS — F339 Major depressive disorder, recurrent, unspecified: Secondary | ICD-10-CM

## 2023-06-20 LAB — LIPID PANEL
Cholesterol: 187 mg/dL (ref 0–200)
HDL: 54 mg/dL (ref >40)
LDL Cholesterol: 125 mg/dL — ABNORMAL HIGH (ref 0–99)
Total CHOL/HDL Ratio: 3.5 ratio
Triglycerides: 41 mg/dL (ref <150)
VLDL: 8 mg/dL (ref 0–40)

## 2023-06-20 MED ORDER — SERTRALINE HCL 25 MG PO TABS
25.0000 mg | ORAL_TABLET | Freq: Every day | ORAL | Status: DC
  Administered 2023-06-20 – 2023-06-23 (×4): 25 mg via ORAL
  Filled 2023-06-20 (×4): qty 1

## 2023-06-20 MED ORDER — WHITE PETROLATUM EX OINT
TOPICAL_OINTMENT | CUTANEOUS | Status: DC | PRN
  Filled 2023-06-20: qty 5

## 2023-06-20 NOTE — Plan of Care (Signed)
   Problem: Education: Goal: Emotional status will improve Outcome: Progressing Goal: Mental status will improve Outcome: Progressing   Problem: Activity: Goal: Interest or engagement in activities will improve Outcome: Progressing

## 2023-06-20 NOTE — Group Note (Signed)
 Date:  06/20/2023 Time:  3:52 PM  Group Topic/Focus:  Outdoor recreation structured activity.    Participation Level:  Active  Participation Quality:  Appropriate  Affect:  Appropriate  Cognitive:  Appropriate  Insight: Appropriate  Engagement in Group:  Developing/Improving and Engaged  Modes of Intervention:  Activity  Additional Comments:    Katlynne Mckercher 06/20/2023, 3:52 PM

## 2023-06-20 NOTE — Progress Notes (Signed)
 Pt calm and pleasant during assessment denying SI/HI/AVH. Pt observed interacting appropriately with staff and peers on the unit by this Clinical research associate.  Pt compliant with medication administration per MD orders. Pt given education, support, and encouragement to be active in her treatment plan. Pt being monitored Q 15 minutes for safety per unit protocol, remains safe on the unit

## 2023-06-20 NOTE — Group Note (Signed)
 Date:  06/20/2023 Time:  11:11 AM  Group Topic/Focus:  Goals Group:   The focus of this group is to help patients establish daily goals to achieve during treatment and discuss how the patient can incorporate goal setting into their daily lives to aide in recovery. Rediscovering Joy:   The focus of this group is to explore various ways to relieve stress in a positive manner.    Participation Level:  Active  Participation Quality:  Appropriate  Affect:  Appropriate  Cognitive:  Appropriate  Insight: Appropriate  Engagement in Group:  Developing/Improving and Engaged  Modes of Intervention:  Activity, Discussion, and Education  Additional Comments:    Rosaura Carpenter 06/20/2023, 11:11 AM

## 2023-06-20 NOTE — [facility] Suicide Risk Assessment (Signed)
 Suicide Risk Assessment  Admission Assessment    [facility] Admission Suicide Risk Assessment   Nursing information obtained from:    Demographic factors:  Unemployed, Adolescent or young adult Current Mental Status:  NA Loss Factors:  NA Historical Factors:  NA Risk Reduction Factors:  Living with another person, especially a relative  Total Time spent with patient: 45 minutes Principal Problem: MDD (major depressive disorder) Diagnosis:  Principal Problem:   MDD (major depressive disorder)  Subjective Data:  18 year old never married African-American female with no reported psychiatric diagnoses, no reported medical history brought to the emergency department under IVC with chief complaints of suicidal ideations, recent suicide attempt where she took 79 Xanax she got from a friend, then [place] a bystander saw and contacted Patent examiner. Patient reports that she has been struggling with depression on and off since childhood, she reports multiple suicide attempts during her teenage years all by attempting to overdose on Xanax, states she has never been hospitalized at inpatient psychiatric unit. Most recently reports that she has been struggling with depressed mood, suicidal thoughts, hopelessness, difficulty sleeping at times for the last 3 to 4 days prior to presenting to the emergency department. She denies any anhedonia, changes in her appetite, worthlessness, guilt, difficulties concentrating. States that this time around she was frustrated because she recently moved in with her mother and 2 brothers ages 1 and 57, CPS is currently involved and she feels as if she needs to intervene because her mother is not doing a good job. States that she is scared that CPS will take away her brothers and she will be able to see them. Prior to a month ago she was living with her grandparents and her 3 year old brother. States she moved in with her mother because it is easier for her  to get to [employer] where she works part-time. Per chart review, it seems that patient cited multiple reasons which lead to recent suicide attempt, stating that she feels as if her mother pays more attention to the mother's boyfriend as well as her younger brothers in comparison to the attention that she gives to the patient. Also per chart review patient reported that her mother uses illicit substances. Today during interview the patient states she is unaware of any family history of substance use. Upon him admission to the emergency department patient expressed regret for not being able to kill herself. Today she expresses that she is grateful to have been found by [facility]. She is currently denying any depression, anxiety, hopelessness, worthlessness, anhedonia, anxiety. Denies SI/HI and AVH. Alert and oriented x 4. Affect is euthymic, guarded.   Continued Clinical Symptoms:  Alcohol Use Disorder Identification Test Final Score (AUDIT): 0 The "Alcohol Use Disorders Identification Test", Guidelines for Use in Primary Care, Second Edition.  World Science writer [facility]). Score between 0-7:  no or low risk or alcohol related problems. Score between 8-15:  moderate risk of alcohol related problems. Score between 16-19:  high risk of alcohol related problems. Score 20 or above:  warrants further diagnostic evaluation for alcohol dependence and treatment.   CLINICAL FACTORS:   Depression:   Impulsivity Alcohol/Substance Abuse/Dependencies   Musculoskeletal: Strength & Muscle Tone: within normal limits Gait & Station: normal Patient leans: N/A  Psychiatric Specialty Exam:  Presentation  General Appearance:  Appropriate for Environment  Eye Contact: Good  Speech: Normal Rate  Speech Volume: Normal  Handedness:No data recorded  Mood and Affect  Mood: Euthymic  Affect: Appropriate   Thought Process  Thought Processes: Coherent  Descriptions of  Associations:Intact  Orientation:Full (Time, Place and Person)  Thought Content:Logical  History of Schizophrenia/Schizoaffective disorder:No  Duration of Psychotic Symptoms:No data recorded Hallucinations:Hallucinations: None  Ideas of Reference:None  Suicidal Thoughts:Suicidal Thoughts: No  Homicidal Thoughts:Homicidal Thoughts: No   Sensorium  Memory: Immediate Good; Recent Good; Remote Good  Judgment: Good  Insight: Good   Executive Functions  Concentration: Good  Attention Span: Good  Recall: Good  Fund of Knowledge:No data recorded Language: Good   Psychomotor Activity  Psychomotor Activity: Psychomotor Activity: Normal   Assets  Assets: Physical Health; Housing; Social Support; Desire for Improvement   Sleep  Sleep: Sleep: Good    Physical Exam: Physical Exam Vitals and nursing note reviewed.  HENT:     Head: Normocephalic and atraumatic.     Nose: Nose normal.     Mouth/Throat:     Mouth: Mucous membranes are moist.  Eyes:     Pupils: Pupils are equal, round, and reactive to light.  Pulmonary:     Effort: Pulmonary effort is normal.  Musculoskeletal:        General: Normal range of motion.     Cervical back: Normal range of motion.  Skin:    General: Skin is warm and dry.  Neurological:     General: No focal deficit present.     Mental Status: She is alert and oriented to person, place, and time.  Psychiatric:        Attention and Perception: Attention and perception normal.        Mood and Affect: Mood normal.        Speech: Speech normal.        Behavior: Behavior normal. Behavior is cooperative.        Thought Content: Thought content normal.        Cognition and Memory: Cognition and memory normal.     Comments: Affect guarded, insight impaired at this time, minimizing symptoms       Review of Systems  Psychiatric/Behavioral:  Positive for substance abuse.   All other systems reviewed and are negative. Blood  pressure 112/70, pulse 95, temperature 98.4 F (36.9 C), resp. rate 18, height 5\' 3"  (1.6 m), weight 41.7 kg, last menstrual period 10/26/2022, SpO2 99%, unknown if currently breastfeeding. Body mass index is 16.3 kg/m.   COGNITIVE FEATURES THAT CONTRIBUTE TO RISK:  None    SUICIDE RISK:   Minimal: No identifiable suicidal ideation.  Patients presenting with no risk factors but with morbid ruminations; may be classified as minimal risk based on the severity of the depressive symptoms  PLAN OF CARE:  -- obtain collateral, at this time pt appears to be minimizing symptoms  -- crisis stabilization, psychiatric evaluation, therapeutic milieu, group participation, medication management for  suicidal ideations, depression, and development of an individualized safety plan.   I certify that inpatient services furnished can reasonably be expected to improve the patient's condition.   Lamichael Youkhana, PA-C 06/20/2023, 3:38 PM

## 2023-06-20 NOTE — BH IP Treatment Plan (Signed)
 Interdisciplinary Treatment and Diagnostic Plan Update  06/20/2023 Time of Session: 09:20 Gabrielle Rich MRN: [id number]  Principal Diagnosis: MDD (major depressive disorder)  Secondary Diagnoses: Principal Problem:   MDD (major depressive disorder)   Current Medications:  Current Facility-Administered Medications  Medication Dose Route Frequency Provider Last Rate Last Admin   acetaminophen (TYLENOL) tablet 650 mg  650 mg Oral Q6H PRN Penn, Cicely, NP       alum & mag hydroxide-simeth (MAALOX/MYLANTA) 200-200-20 MG/5ML suspension 30 mL  30 mL Oral Q4H PRN Penn, Cicely, NP       haloperidol (HALDOL) tablet 5 mg  5 mg Oral TID PRN Mcneil Sober, NP       And   diphenhydrAMINE (BENADRYL) capsule 50 mg  50 mg Oral TID PRN Penn, Cranston Neighbor, NP       haloperidol lactate (HALDOL) injection 5 mg  5 mg Intramuscular TID PRN Penn, Cranston Neighbor, NP       And   diphenhydrAMINE (BENADRYL) injection 50 mg  50 mg Intramuscular TID PRN Penn, Cranston Neighbor, NP       And   LORazepam (ATIVAN) injection 2 mg  2 mg Intramuscular TID PRN Mcneil Sober, NP       haloperidol lactate (HALDOL) injection 10 mg  10 mg Intramuscular TID PRN Mcneil Sober, NP       And   diphenhydrAMINE (BENADRYL) injection 50 mg  50 mg Intramuscular TID PRN Penn, Cranston Neighbor, NP       And   LORazepam (ATIVAN) injection 2 mg  2 mg Intramuscular TID PRN Penn, Cranston Neighbor, NP       magnesium hydroxide (MILK OF MAGNESIA) suspension 30 mL  30 mL Oral Daily PRN Penn, Cranston Neighbor, NP       melatonin tablet 2.5 mg  2.5 mg Oral QHS Myriam Forehand, NP   2.5 mg at 06/19/23 2128   traZODone (DESYREL) tablet 50 mg  50 mg Oral QHS Myriam Forehand, NP   50 mg at 06/19/23 2128   white petrolatum (VASELINE) gel   Topical PRN Verner Chol, MD       PTA Medications: Medications Prior to Admission  Medication Sig Dispense Refill Last Dose/Taking   acetaminophen (TYLENOL) 500 MG tablet Take 1 tablet (500 mg total) by mouth every 6 (six) hours as needed for mild pain or  moderate pain (>101.5 F). (Patient not taking: Reported on 06/18/2023) 30 tablet 0    ibuprofen (ADVIL) 200 MG tablet Take 1 tablet (200 mg total) by mouth every 6 (six) hours as needed for mild pain or moderate pain. (Patient not taking: Reported on 06/18/2023) 30 tablet 0    medroxyPROGESTERone (DEPO-PROVERA) 150 MG/ML injection Inject 150 mg into the muscle every 3 (three) months.       Patient Stressors: Educational concerns   Other: Not having job or finishing school. "Feels like Im not doing enough in life"    Patient Strengths: Ability for insight  Supportive family/friends   Treatment Modalities: Medication Management, Group therapy, Case management,  1 to 1 session with clinician, Psychoeducation, Recreational therapy.   Physician Treatment Plan for Primary Diagnosis: MDD (major depressive disorder) Long Term Goal(s):     Short Term Goals:    Medication Management: Evaluate patient's response, side effects, and tolerance of medication regimen.  Therapeutic Interventions: 1 to 1 sessions, Unit Group sessions and Medication administration.  Evaluation of Outcomes: Not Met  Physician Treatment Plan for Secondary Diagnosis: Principal Problem:   MDD (major depressive disorder)  Long  Term Goal(s):     Short Term Goals:       Medication Management: Evaluate patient's response, side effects, and tolerance of medication regimen.  Therapeutic Interventions: 1 to 1 sessions, Unit Group sessions and Medication administration.  Evaluation of Outcomes: Not Met   RN Treatment Plan for Primary Diagnosis: MDD (major depressive disorder) Long Term Goal(s): Knowledge of disease and therapeutic regimen to maintain health will improve  Short Term Goals: Ability to remain free from injury will improve, Ability to verbalize frustration and anger appropriately will improve, Ability to demonstrate self-control, Ability to participate in decision making will improve, Ability to verbalize  feelings will improve, Ability to disclose and discuss suicidal ideas, Ability to identify and develop effective coping behaviors will improve, and Compliance with prescribed medications will improve  Medication Management: RN will administer medications as ordered by provider, will assess and evaluate patient's response and provide education to patient for prescribed medication. RN will report any adverse and/or side effects to prescribing provider.  Therapeutic Interventions: 1 on 1 counseling sessions, Psychoeducation, Medication administration, Evaluate responses to treatment, Monitor vital signs and CBGs as ordered, Perform/monitor CIWA, COWS, AIMS and Fall Risk screenings as ordered, Perform wound care treatments as ordered.  Evaluation of Outcomes: Not Met   LCSW Treatment Plan for Primary Diagnosis: MDD (major depressive disorder) Long Term Goal(s): Safe transition to appropriate next level of care at discharge, Engage patient in therapeutic group addressing interpersonal concerns.  Short Term Goals: Engage patient in aftercare planning with referrals and resources, Increase social support, Increase ability to appropriately verbalize feelings, Increase emotional regulation, Facilitate acceptance of mental health diagnosis and concerns, Identify triggers associated with mental health/substance abuse issues, and Increase skills for wellness and recovery  Therapeutic Interventions: Assess for all discharge needs, 1 to 1 time with Social worker, Explore available resources and support systems, Assess for adequacy in community support network, Educate family and significant other(s) on suicide prevention, Complete Psychosocial Assessment, Interpersonal group therapy.  Evaluation of Outcomes: Not Met   Progress in Treatment: Attending groups: No. Participating in groups: No. Taking medication as prescribed: Yes. Toleration medication: Yes. Family/Significant other contact made: No, will  contact:  when given permission.  Patient understands diagnosis: Yes. Discussing patient identified problems/goals with staff: Yes. Medical problems stabilized or resolved: Yes. Denies suicidal/homicidal ideation: Yes. Issues/concerns per patient self-inventory: No. Other: none.   New problem(s) identified: No, Describe:  none identified.  New Short Term/Long Term Goal(s): medication management for mood stabilization; elimination of SI thoughts; development of comprehensive mental wellness/sobriety plan.  Patient Goals:  "Just to stay calm in situations where I don't know what to do."   Discharge Plan or Barriers: CSW will assist pt with development of an appropriate aftercare/discharge plan.   Reason for Continuation of Hospitalization: Anxiety Depression Medication stabilization Suicidal ideation  Estimated Length of Stay: 1-7 days  Last 3 [place] Suicide Severity Risk Score: Flowsheet Row Admission (Current) from 06/19/2023 in [facility] ED from 06[facility]18/2023 in [facility] Emergency Department at [facility] ED from 12/15/2022 in [facility] Emergency Department at [facility]  C-SSRS RISK CATEGORY High Risk No Risk No Risk       Last PHQ 2/9 Scores:     No data to display          Scribe for Treatment Team: Glenis Smoker, LCSW 06/20/2023 10:58 AM
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
## 2023-06-20 NOTE — Group Note (Signed)
 Date:  06/20/2023 Time:  8:48 PM  Group Topic/Focus:  Wrap-Up Group:   The focus of this group is to help patients review their daily goal of treatment and discuss progress on daily workbooks.    Participation Level:  Active  Participation Quality:  Appropriate  Affect:  Appropriate  Cognitive:  Appropriate  Insight: Appropriate  Engagement in Group:  Engaged  Modes of Intervention:  Activity  Additional Comments:    Mary Sella Aayushi Solorzano 06/20/2023, 8:48 PM
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
## 2023-06-20 NOTE — Group Note (Signed)
 LCSW Group Therapy Note   Group Date: 06/20/2023 Start Time: 1300 End Time: 1400   Type of Therapy and Topic:  Group Therapy: Challenging Core Beliefs  Participation Level:  Active  Description of Group:  Patients were educated about core beliefs and asked to identify one harmful core belief that they have. Patients were asked to explore from where those beliefs originate. Patients were asked to discuss how those beliefs make them feel and the resulting behaviors of those beliefs. They were then be asked if those beliefs are true and, if so, what evidence they have to support them. Lastly, group members were challenged to replace those negative core beliefs with helpful beliefs.   Therapeutic Goals:   1. Patient will identify harmful core beliefs and explore the origins of such beliefs. 2. Patient will identify feelings and behaviors that result from those core beliefs. 3. Patient will discuss whether such beliefs are true. 4.  Patient will replace harmful core beliefs with helpful ones.  Summary of Patient Progress:  Patient actively engaged in processing and exploring how core beliefs are formed and how they impact thoughts, feelings, and behaviors. Patient proved open to input from peers and feedback from CSW. Patient demonstrated proficient insight into the subject matter, was respectful and supportive of peers, and participated throughout the entire session.  Therapeutic Modalities: Cognitive Behavioral Therapy; Solution-Focused Therapy   Gabrielle Rich 06/20/2023  1:57 PM

## 2023-06-20 NOTE — Progress Notes (Signed)
   06/20/23 0830  Psych Admission Type (Psych Patients Only)  Admission Status Involuntary  Psychosocial Assessment  Patient Complaints Depression  Eye Contact Fair  Facial Expression Animated  Affect Appropriate to circumstance  Speech Logical/coherent  Interaction Minimal  Motor Activity Slow  Appearance/Hygiene Unremarkable  Behavior Characteristics Fidgety  Mood Depressed;Pleasant  Thought Process  Coherency WDL  Content Blaming self  Delusions None reported or observed  Perception WDL  Hallucination None reported or observed  Judgment Limited  Confusion WDL  Danger to Self  Current suicidal ideation? Denies  Self-Injurious Behavior No self-injurious ideation or behavior indicators observed or expressed   Agreement Not to Harm Self Yes  Description of Agreement verbal  Danger to Others  Danger to Others None reported or observed   D: Pt alert and oriented. Pt denies experiencing any anxiety/depression at this time. Pt denies experiencing any pain at this time. Pt denies experiencing any SI/HI, or AVH at this time.     A: Scheduled medications administered to pt, per MD orders. Support and encouragement provided. Frequent verbal contact made. Routine safety checks conducted q15 minutes.   R: No adverse drug reactions noted. Pt verbally contracts for safety at this time. Pt complaint with medications. Pt interacts appropriately with others on the unit. Pt remains safe at this time. Plan of care ongoing.

## 2023-06-20 NOTE — Group Note (Signed)
 Recreation Therapy Group Note   Group Topic:Coping Skills  Group Date: 06/20/2023 Start Time: 1015 End Time: 1100 Facilitators: Rosina Lowenstein, LRT, CTRS Location:  Craft Room  Group Description: Mind Map.  Patient was provided a blank template of a diagram with 32 blank boxes in a tiered system, branching from the center (similar to a bubble chart). LRT directed patients to label the middle of the diagram "Coping Skills". LRT and patients then came up with 8 different coping skills as examples. Pt were directed to record their coping skills in the 2nd tier boxes closest to the center.  Patients would then share their coping skills with the group as LRT wrote them out. LRT gave a handout of 99 different coping skills at the end of group.   Goal Area(s) Addressed: Patients will be able to define "coping skills". Patient will identify new coping skills.  Patient will increase communication.   Affect/Mood: Appropriate   Participation Level: Active and Engaged   Participation Quality: Independent   Behavior: Alert and Cooperative   Speech/Thought Process: Coherent   Insight: Fair   Judgement: Fair    Modes of Intervention: Clarification, Education, Guided Discussion, Support, and Worksheet   Patient Response to Interventions:  Attentive, Engaged, Interested , and Receptive   Education Outcome:  Acknowledges education   Clinical Observations/Individualized Feedback: Gabrielle Rich was active in their participation of session activities and group discussion. Pt identified "pole dancing" as a coping skill. Pt spontaneously contributed to group discussion once. Pt interacted well with LRT and peers duration of session.    Plan: Continue to engage patient in RT group sessions 2-3x/week.   Rosina Lowenstein, LRT, CTRS 06/20/2023 11:33 AM

## 2023-06-20 NOTE — Group Note (Signed)
 Recreation Therapy Group Note   Group Topic:Health and Wellness  Group Date: 06/20/2023 Start Time: 1500 End Time: 1600 Facilitators: Rosina Lowenstein, LRT, CTRS Location: Courtyard  Group Description: [employer]. LRT and patients played games of basketball, drew with chalk, and played corn hole while outside in the courtyard while getting fresh air and sunlight. Music was being played in the background. LRT and peers conversed about different games they have played before, what they do in their free time and anything else that is on their minds. LRT encouraged pts to drink water after being outside, sweating and getting their heart rate up.  Goal Area(s) Addressed: Patient will build on frustration tolerance skills. Patients will partake in a competitive play game with peers. Patients will gain knowledge of new leisure interest/hobby.   Affect/Mood: Appropriate   Participation Level: Active   Participation Quality: Independent   Behavior: Appropriate   Speech/Thought Process: Coherent   Insight: Good   Judgement: Good   Modes of Intervention: Activity   Patient Response to Interventions:  Receptive   Education Outcome:  Acknowledges education   Clinical Observations/Individualized Feedback: Gabrielle Rich was active in their participation of session activities and group discussion. Pt chose to play basketball while outside with peers. Pt interacted well with LRT and peers duration of session.    Plan: Continue to engage patient in RT group sessions 2-3x/week.   Rosina Lowenstein, LRT, CTRS 06/20/2023 5:09 PM

## 2023-06-21 NOTE — Group Note (Signed)
 [facility] LCSW Group Therapy Note   Group Date: 06/21/2023 Start Time: 1315 End Time: 1415   Type of Therapy and Topic: Group Therapy: Avoiding Self-Sabotaging and Enabling Behaviors  Participation Level: None  Mood:  Description of Group:  In this group, patients will learn how to identify obstacles, self-sabotaging and enabling behaviors, as well as: what are they, why do we do them and what needs these behaviors meet. Discuss unhealthy relationships and how to have positive healthy boundaries with those that sabotage and enable. Explore aspects of self-sabotage and enabling in yourself and how to limit these self-destructive behaviors in everyday life.   Therapeutic Goals: 1. Patient will identify one obstacle that relates to self-sabotage and enabling behaviors 2. Patient will identify one personal self-sabotaging or enabling behavior they did prior to admission 3. Patient will state a plan to change the above identified behavior 4. Patient will demonstrate ability to communicate their needs through discussion and/or role play.    Summary of Patient Progress: Patient was present, however, did not engage in group discussions.    Therapeutic Modalities:  Cognitive Behavioral Therapy Person-Centered Therapy Motivational Interviewing    Harden Mo, LCSW

## 2023-06-21 NOTE — Plan of Care (Signed)
   Problem: Education: Goal: Knowledge of [facility] information/materials will improve Outcome: Progressing Goal: Emotional status will improve Outcome: Progressing Goal: Mental status will improve Outcome: Progressing

## 2023-06-21 NOTE — Progress Notes (Signed)
 Pt calm and pleasant during assessment denying SI/HI/AVH. Pt observed interacting appropriately with staff and peers on the unit by this Clinical research associate.  Pt compliant with medication administration per MD orders. Pt given education, support, and encouragement to be active in her treatment plan. Pt being monitored Q 15 minutes for safety per unit protocol, remains safe on the unit

## 2023-06-21 NOTE — Progress Notes (Signed)
 Patient states that overall, "I feel good".   06/21/23 0827  Psych Admission Type (Psych Patients Only)  Admission Status Involuntary  Psychosocial Assessment  Patient Complaints None  Eye Contact Other (Comment) (WNL)  Facial Expression Other (Comment) (Appropriate)  Affect Appropriate to circumstance  Speech Logical/coherent  Interaction Other (Comment) (Appropriate)  Motor Activity Other (Comment) (WNL)  Appearance/Hygiene Unremarkable  Behavior Characteristics Cooperative;Appropriate to situation  Mood Pleasant  Thought Process  Coherency WDL  Danger to Self  Current suicidal ideation? Denies  Self-Injurious Behavior No self-injurious ideation or behavior indicators observed or expressed   Agreement Not to Harm Self Yes  Description of Agreement Verbal  Danger to Others  Danger to Others None reported or observed

## 2023-06-21 NOTE — Group Note (Signed)
 Recreation Therapy Group Note   Group Topic:Health and Wellness  Group Date: 06/21/2023 Start Time: 1440 End Time: 1600 Facilitators: Rosina Lowenstein, LRT, CTRS Location: Courtyard  Group Description: [employer]. LRT and patients played games of basketball, drew with chalk, and played corn hole while outside in the courtyard while getting fresh air and sunlight. Music was being played in the background. LRT and peers conversed about different games they have played before, what they do in their free time and anything else that is on their minds. LRT encouraged pts to drink water after being outside, sweating and getting their heart rate up.  Goal Area(s) Addressed: Patient will build on frustration tolerance skills. Patients will partake in a competitive play game with peers. Patients will gain knowledge of new leisure interest/hobby.    Affect/Mood: Appropriate   Participation Level: Active   Participation Quality: Independent   Behavior: Appropriate   Speech/Thought Process: Coherent   Insight: Good   Judgement: Good   Modes of Intervention: Activity   Patient Response to Interventions:  Receptive   Education Outcome:  Acknowledges education   Clinical Observations/Individualized Feedback: Gabrielle Rich was active in their participation of session activities and group discussion. Pt chose to play basketball and talk with peers while outside. Pt interacted well with LRT and peers duration of session.    Plan: Continue to engage patient in RT group sessions 2-3x/week.   Rosina Lowenstein, LRT, CTRS 06/21/2023 4:59 PM

## 2023-06-21 NOTE — [facility] Counselor (Signed)
 CSW touched base with patient's aunt Brandice Busser at [phone] to conduct SPE and get an idea of patient's baseline to prepare with team for patient's safe discharge. Aunt explained that the patient works a full-time job and is usually punctual with going to work and returning home. Patient lives between her mother and grandmother's home. Aunt reported that they have a "pretty close family" and that the patient is "a family person." Aunt reported that the patient has a strong support system that includes her. The two share a close relationship according to the aunt. Aunt reports that the patient is known to "laugh a lot when anxious or nervous." Patient's aunt also added that the patient does experience some anxiety but nothing "out of the ordinary." Aunt shared that the patient usually has a decent appetite and sleep pattern.
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
## 2023-06-21 NOTE — [facility] Suicide Risk Assessment (Signed)
 [facility] INPATIENT:  Family/Significant Other Suicide Prevention Education  Suicide Prevention Education:  Education Completed; Avari Nevares, aunt, [phone], has been identified by the patient as the family member/significant other with whom the patient will be residing, and identified as the person(s) who will aid the patient in the event of a mental health crisis (suicidal ideations/suicide attempt).  With written consent from the patient, the family member/significant other has been provided the following suicide prevention education, prior to the and/or following the discharge of the patient.  The suicide prevention education provided includes the following: Suicide risk factors Suicide prevention and interventions National Suicide Hotline telephone number [facility] assessment telephone number [facility] Emergency Assistance 911 [facility] and/or Residential Mobile Crisis Unit telephone number  Request made of family/significant other to: Remove weapons (e.g., guns, rifles, knives), all items previously/currently identified as safety concern.   Remove drugs/medications (over-the-counter, prescriptions, illicit drugs), all items previously/currently identified as a safety concern.  The family member/significant other verbalizes understanding of the suicide prevention education information provided.  The family member/significant other agrees to remove the items of safety concern listed above.  Lowry Ram 06/21/2023, 2:08 PM

## 2023-06-21 NOTE — Progress Notes (Signed)
 [facility] MD Progress Note  06/21/2023 9:50 PM Gabrielle Rich  MRN:  [id number]  18 year old never married African-American female with no reported psychiatric diagnoses, no reported medical history brought to the emergency department under IVC with chief complaints of suicidal ideations, recent suicide attempt where she took 10 Xanax she got from a friend, then [place] a bystander saw and contacted Patent examiner.   Subjective:  Case discussed during AM multidisciplinary team meeting, vitals and notes reviewed. No reported behavioral issues overnight. Pt reports that she is doing well. Has been observed out and about on milieu, interacting with others appropriately, attending groups. Reports that she has been speaking to her family as well as her boyfriend, everyone has been supportive. Continues to express regret for recent suicide attempt. She denies depressed mood, anxiety, SI/Hi and AVH. Alert and oriented x4. Affect is euthymic. Insight and judgement fair. No other concerns at this time. Med compliant, denies med side effects.    Principal Problem: MDD (major depressive disorder) Diagnosis: Principal Problem:   MDD (major depressive disorder)  Total Time spent with patient: 20 minutes  Past Psychiatric History:  Patient denies hx of mental health evaluation Current caregiver:  Patient is own guardian/ care giver Past hospitalizations:  denies  Medication trials : denies  Suicide attempts: x3, all by OD on Xanax (unprescribed), last was at 18 yrs old, reports that she was never hospitalized, never mentioned to anyone Patient denies ever having an Act/CST team. Denies ECT, Clozaril treatments.  Past Medical History:  Past Medical History:  Diagnosis Date   Depression    Substance abuse (HCC)     Past Surgical History:  Procedure Laterality Date   LAPAROSCOPIC APPENDECTOMY N/A 11/15/2020   Procedure: APPENDECTOMY LAPAROSCOPIC;  Surgeon: Leonia Corona,  MD;  Location: [facility] OR;  Service: Pediatrics;  Laterality: N/A;   Family History: History reviewed. No pertinent family history. Family Psychiatric  History: Denies family hx of mental illness, suicide. Unknown family hx of substance use  Social History:  Social History   Substance and Sexual Activity  Alcohol Use Not Currently     Social History   Substance and Sexual Activity  Drug Use Yes   Frequency: 7.0 times per week   Types: Marijuana   Comment: daily, 2-3 blunts    Social History   Socioeconomic History   Marital status: Single    Spouse name: Not on file   Number of children: 0   Years of education: Not on file   Highest education level: 9th grade  Occupational History   Occupation: Visual merchandiser part time  Tobacco Use   Smoking status: Never   Smokeless tobacco: Never  Vaping Use   Vaping status: Every Day   Substances: Nicotine   Devices: e-cig  Substance and Sexual Activity   Alcohol use: Not Currently   Drug use: Yes    Frequency: 7.0 times per week    Types: Marijuana    Comment: daily, 2-3 blunts   Sexual activity: Yes    Birth control/protection: Injection    Comment: last admin 4 mons ago  Other Topics Concern   Not on file  Social History Narrative   Was living with grandparents and 38 yr old brother until a month ago, moved in with her mother, and 2 siblings ages 35 and 71 yrs old. Reports that CPS is currently involved with family   Social Drivers of Health   Financial Resource Strain: Low Risk  (05/03/2023)  Received from [facility]   Overall Financial Resource Strain (CARDIA)    Difficulty of Paying Living Expenses: Not hard at all  Food Insecurity: No Food Insecurity (06/19/2023)   Hunger Vital Sign    Worried About Running Out of Food in the Last Year: Never true    Ran Out of Food in the Last Year: Never true  Transportation Needs: No Transportation Needs (06/19/2023)   PRAPARE - Scientist, research (physical sciences) (Medical): No    Lack of Transportation (Non-Medical): No  Physical Activity: Not on file  Stress: Not on file  Social Connections: Not on file   Additional Social History:                         Sleep: Good  Appetite:  Good  Current Medications: Current Facility-Administered Medications  Medication Dose Route Frequency Provider Last Rate Last Admin   acetaminophen (TYLENOL) tablet 650 mg  650 mg Oral Q6H PRN Darby Shadwick, PA-C       alum & mag hydroxide-simeth (MAALOX/MYLANTA) 200-200-20 MG/5ML suspension 30 mL  30 mL Oral Q4H PRN Aevah Stansbery, PA-C       haloperidol (HALDOL) tablet 5 mg  5 mg Oral TID PRN Awesome Jared, PA-C       And   diphenhydrAMINE (BENADRYL) capsule 50 mg  50 mg Oral TID PRN Harli Engelken, PA-C       haloperidol lactate (HALDOL) injection 5 mg  5 mg Intramuscular TID PRN Rovena Hearld, PA-C       And   diphenhydrAMINE (BENADRYL) injection 50 mg  50 mg Intramuscular TID PRN Demecia Northway, PA-C       And   LORazepam (ATIVAN) injection 2 mg  2 mg Intramuscular TID PRN Lindyn Vossler, PA-C       haloperidol lactate (HALDOL) injection 10 mg  10 mg Intramuscular TID PRN Catherine Cubero, PA-C       And   diphenhydrAMINE (BENADRYL) injection 50 mg  50 mg Intramuscular TID PRN Helen Winterhalter, PA-C       And   LORazepam (ATIVAN) injection 2 mg  2 mg Intramuscular TID PRN Ashelynn Marks, PA-C       magnesium hydroxide (MILK OF MAGNESIA) suspension 30 mL  30 mL Oral Daily PRN Edwinna Rochette, PA-C       melatonin tablet 2.5 mg  2.5 mg Oral QHS Kadeen Sroka, PA-C   2.5 mg at 06/21/23 2122   sertraline (ZOLOFT) tablet 25 mg  25 mg Oral Daily Lourie Retz, PA-C   25 mg at 06/21/23 1610   traZODone (DESYREL) tablet 50 mg  50 mg Oral QHS Lauramae Kneisley, PA-C   50 mg at 06/21/23 2122   white petrolatum (VASELINE) gel   Topical PRN [place] Warriner, Judeth Cornfield, PA-C        Lab  Results:  Results for orders placed or performed during the hospital encounter of 06/19/23 (from the past 48 hours)  Lipid panel     Status: Abnormal   Collection Time: 06/20/23  4:42 PM  Result Value Ref Range   Cholesterol 187 0 - 200 mg/dL   Triglycerides 41 <960 mg/dL   HDL 54 >45 mg/dL   Total CHOL/HDL Ratio 3.5 RATIO   VLDL 8 0 - 40 mg/dL   LDL Cholesterol 409 (H) 0 - 99 mg/dL    Comment:        Total Cholesterol/HDL:CHD Risk Coronary Heart Disease Risk Table  Men   Women  1/2 Average Risk   3.4   3.3  Average Risk       5.0   4.4  2 X Average Risk   9.6   7.1  3 X Average Risk  23.4   11.0        Use the calculated Patient Ratio above and the CHD Risk Table to determine the patient's CHD Risk.        ATP III CLASSIFICATION (LDL):  <100     mg/dL   Optimal  132-440  mg/dL   Near or Above                    Optimal  130-159  mg/dL   Borderline  102-725  mg/dL   High  >366     mg/dL   Very High Performed at [facility], [address] [place], [place] [address]     Blood Alcohol level:  Lab Results  Component Value Date   [facility] <10 06/18/2023    Metabolic Disorder Labs: No results found for: "HGBA1C", "MPG" No results found for: "PROLACTIN" Lab Results  Component Value Date   CHOL 187 06/20/2023   TRIG 41 06/20/2023   HDL 54 06/20/2023   CHOLHDL 3.5 06/20/2023   VLDL 8 06/20/2023   LDLCALC 125 (H) 06/20/2023    Physical Findings: AIMS:  , ,  ,  ,    CIWA:    COWS:     Musculoskeletal: Strength & Muscle Tone: within normal limits Gait & Station: normal Patient leans: N/A  Psychiatric Specialty Exam:  Presentation  General Appearance:  Appropriate for Environment  Eye Contact: Good  Speech: Normal Rate  Speech Volume: Normal  Handedness:No data recorded  Mood and Affect  Mood: Euthymic  Affect: Appropriate   Thought Process  Thought Processes: Coherent  Descriptions of  Associations:Intact  Orientation:Full (Time, Place and Person)  Thought Content:Logical  History of Schizophrenia/Schizoaffective disorder:No  Duration of Psychotic Symptoms:No data recorded Hallucinations:Hallucinations: None  Ideas of Reference:None  Suicidal Thoughts:Suicidal Thoughts: No  Homicidal Thoughts:Homicidal Thoughts: No   Sensorium  Memory: Immediate Good; Recent Good; Remote Good  Judgment: Good  Insight: Good   Executive Functions  Concentration: Good  Attention Span: Good  Recall: Good  Fund of Knowledge:No data recorded Language: Good   Psychomotor Activity  Psychomotor Activity: Psychomotor Activity: Normal   Assets  Assets: Physical Health; Housing; Social Support; Desire for Improvement   Sleep  Sleep: Sleep: Good    Physical Exam: Physical Exam Vitals and nursing note reviewed.  Constitutional:      Appearance: Normal appearance.  HENT:     Head: Normocephalic and atraumatic.  Eyes:     Pupils: Pupils are equal, round, and reactive to light.  Pulmonary:     Effort: Pulmonary effort is normal.  Musculoskeletal:        General: Normal range of motion.     Cervical back: Normal range of motion.  Skin:    General: Skin is dry.  Neurological:     General: No focal deficit present.     Mental Status: She is alert and oriented to person, place, and time.  Psychiatric:        Attention and Perception: Attention and perception normal.        Mood and Affect: Mood and affect normal.        Speech: Speech normal.        Behavior: Behavior normal. Behavior is  cooperative.        Thought Content: Thought content normal.        Cognition and Memory: Cognition and memory normal.        Judgment: Judgment normal.    Review of Systems  All other systems reviewed and are negative.  Blood pressure 116/71, pulse 64, temperature 97.9 F (36.6 C), resp. rate 17, height 5\' 3"  (1.6 m), weight 41.7 kg, last menstrual period  10/26/2022, SpO2 100%, unknown if currently breastfeeding. Body mass index is 16.3 kg/m.   Treatment Plan Summary:  1.    Safety and Monitoring:   --  Involuntary admission to inpatient psychiatric unit for safety, stabilization and treatment -- Daily contact with patient to assess and evaluate symptoms and progress in treatment -- Patient's case to be discussed in multi-disciplinary team meeting -- Observation Level : q15 minute checks -- Vital signs:  q12 hours -- Precautions: suicide   2. Psychiatric Diagnoses and Treatment:    -- Continue Zoloft 25 mg daily for MDD   --  The risks/benefits/side-effects/alternatives to this medication were discussed in detail with the patient and time was given for questions. The patient consents to medication trial.  -- Metabolic profile and EKG monitoring obtained while on an atypical antipsychotic  (BMI: 16.30; Lipid Panel: unremarkable;  HbgA1c: pending; QTc: pending)  -- Encouraged patient to participate in unit milieu and in scheduled group therapies  -- Short Term Goals: Ability to identify changes in lifestyle to reduce recurrence of condition will improve, Ability to verbalize feelings will improve, Ability to disclose and discuss suicidal ideas, Ability to demonstrate self-control will improve, Ability to identify and develop effective coping behaviors will improve, Ability to maintain clinical measurements within normal limits will improve, Compliance with prescribed medications will improve, and Ability to identify triggers associated with substance abuse/mental health issues will improve -- Long Term Goals: Improvement in symptoms so as ready for discharge        3. Medical Issues Being Addressed:                                       --- Home med Depo Provera last administered 4 months ago, pt unable to tell last LMP.  uPT negative on 06/18/23, recommend follow up with outpatient provider                Nicotine dependence              -- Nicotine gum 2mg  PRN             -- Smoking cessation encouraged               Marijuana dependence             -- Counseling on risks of continued use.    4. Discharge Planning:   -- Social work and case management to assist with discharge planning and identification of hospital follow-up needs prior to discharge -- Estimated LOS: 5-7 days -- Discharge Concerns: Need to establish a safety plan; Medication compliance and effectiveness -- Discharge Goals: Return home with outpatient referrals for mental health follow-up including medication management/psychotherapy   Paulene Floor, PA-C 06/21/2023, 9:50 PM

## 2023-06-21 NOTE — Group Note (Signed)
 Date:  06/21/2023 Time:  9:43 AM  Group Topic/Focus:  Goals Group:   The focus of this group is to help patients establish daily goals to achieve during treatment and discuss how the patient can incorporate goal setting into their daily lives to aide in recovery.    Participation Level:  Active  Participation Quality:  Appropriate  Affect:  Appropriate  Cognitive:  Appropriate  Insight: Appropriate  Engagement in Group:  Engaged  Modes of Intervention:  Discussion, Education, and Support  Additional Comments:    Wilford Corner 06/21/2023, 9:43 AM

## 2023-06-21 NOTE — Plan of Care (Signed)
  Problem: Education: Goal: Knowledge of [facility] Education information/materials will improve Outcome: Progressing Goal: Emotional status will improve Outcome: Progressing Goal: Mental status will improve Outcome: Progressing Goal: Verbalization of understanding the information provided will improve Outcome: Progressing   Problem: Activity: Goal: Interest or engagement in activities will improve Outcome: Progressing Goal: Sleeping patterns will improve Outcome: Progressing   Problem: Coping: Goal: Ability to verbalize frustrations and anger appropriately will improve Outcome: Progressing Goal: Ability to demonstrate self-control will improve Outcome: Progressing   Problem: Health Behavior/Discharge Planning: Goal: Identification of resources available to assist in meeting health care needs will improve Outcome: Progressing Goal: Compliance with treatment plan for underlying cause of condition will improve Outcome: Progressing   Problem: Physical Regulation: Goal: Ability to maintain clinical measurements within normal limits will improve Outcome: Progressing   Problem: Safety: Goal: Periods of time without injury will increase Outcome: Progressing   Problem: Education: Goal: Ability to make informed decisions regarding treatment will improve Outcome: Progressing   Problem: Coping: Goal: Coping ability will improve Outcome: Progressing   Problem: Health Behavior/Discharge Planning: Goal: Identification of resources available to assist in meeting health care needs will improve Outcome: Progressing   Problem: Medication: Goal: Compliance with prescribed medication regimen will improve Outcome: Progressing   Problem: Self-Concept: Goal: Ability to disclose and discuss suicidal ideas will improve Outcome: Progressing   Problem: Education: Goal: Utilization of techniques to improve thought processes will improve Outcome: Progressing Goal: Knowledge of  the prescribed therapeutic regimen will improve Outcome: Progressing   Problem: Activity: Goal: Interest or engagement in leisure activities will improve Outcome: Progressing Goal: Imbalance in normal sleep/wake cycle will improve Outcome: Progressing   Problem: Coping: Goal: Coping ability will improve Outcome: Progressing Goal: Will verbalize feelings Outcome: Progressing   Problem: Health Behavior/Discharge Planning: Goal: Ability to make decisions will improve Outcome: Progressing Goal: Compliance with therapeutic regimen will improve Outcome: Progressing   Problem: Role Relationship: Goal: Will demonstrate positive changes in social behaviors and relationships Outcome: Progressing   Problem: Safety: Goal: Ability to disclose and discuss suicidal ideas will improve Outcome: Progressing Goal: Ability to identify and utilize support systems that promote safety will improve Outcome: Progressing   Problem: Self-Concept: Goal: Will verbalize positive feelings about self Outcome: Progressing Goal: Level of anxiety will decrease Outcome: Progressing

## 2023-06-22 LAB — HEMOGLOBIN A1C
Hgb A1c MFr Bld: 4.9 % (ref 4.8–5.6)
Mean Plasma Glucose: 94 mg/dL

## 2023-06-22 NOTE — Progress Notes (Signed)
   06/22/23 0840  Psych Admission Type (Psych Patients Only)  Admission Status Involuntary  Psychosocial Assessment  Eye Contact Other (Comment) (good)  Facial Expression Other (Comment) (appropriate)  Affect Appropriate to circumstance  Speech Soft  Interaction Forwards little  Motor Activity Other (Comment) (WNL)  Appearance/Hygiene Unremarkable  Behavior Characteristics Cooperative;Appropriate to situation  Mood Pleasant  Danger to Self  Current suicidal ideation? Denies  Self-Injurious Behavior No self-injurious ideation or behavior indicators observed or expressed   Agreement Not to Harm Self Yes  Description of Agreement verbal  Danger to Others  Danger to Others None reported or observed

## 2023-06-22 NOTE — Progress Notes (Signed)
 Pt calm and pleasant during assessment denying SI/HI/AVH. Pt observed interacting appropriately with staff and peers on the unit by this Clinical research associate.  Pt compliant with medication administration per MD orders. Pt given education, support, and encouragement to be active in her treatment plan. Pt being monitored Q 15 minutes for safety per unit protocol, remains safe on the unit

## 2023-06-22 NOTE — Progress Notes (Deleted)
   06/22/23 0840  Charting Type  Charting Type Shift assessment  Safety Check Verification  Has the RN verified the 15 minute safety check completion? Yes  Neurological  Neuro (WDL) WDL  HEENT  HEENT (WDL) WDL  Respiratory  Respiratory (WDL) WDL  Cardiac  Cardiac (WDL) WDL  Vascular  Vascular (WDL) WDL  Integumentary  Integumentary (WDL) WDL  Braden Scale (Ages 8 and up)  Sensory Perceptions 4  Moisture 4  Activity 4  Mobility 4  Nutrition 3  Friction and Shear 3  Braden Scale Score 22  Musculoskeletal  Musculoskeletal (WDL) WDL  Assistive Device None  Gastrointestinal  Gastrointestinal (WDL) WDL  GU Assessment  Genitourinary (WDL) WDL  Neurological  Level of Consciousness Alert

## 2023-06-22 NOTE — [facility] Counselor (Signed)
 CSW received call from pt's grandmother, Henrietta Cieslewicz [phone]). Mcclean and CSW discussed pt progress. Formoso shared that she felt pt is getting back to herself. She shared that previously pt was getting frustrated and agitated very easily. Grandmother shared that even when she visited on Monday pt was beginning to look better and appeared more settled. Manninen stated that prior to coming to the hospital pt seemed like she just didn't want to be here anymore. However, Behlke stated that now pt looks a lot better and appeared to be getting better. She shared that pt does have some issues with impatience and getting bored easily. Errico asked for an update regarding pt progress. CSW updated her that pt had mentioned trying to go back to school and appeared goal-oriented (also focused on a interview for a second job that she was excited about). Tillery expressed gratitude for the help that pt has been receiving while here in the hospital. No other concerns expressed. Contact ended without incident.   Vilma Meckel. Algis Greenhouse, MSW, LCSW, LCAS 06/22/2023 10:15 AM

## 2023-06-22 NOTE — Group Note (Signed)
 Date:  06/22/2023 Time:  10:12 AM  Group Topic/Focus:  Making Healthy Choices:   The focus of this group is to help patients identify negative/unhealthy choices they were using prior to admission and identify positive/healthier coping strategies to replace them upon discharge. Self Care:   The focus of this group is to help patients understand the importance of self-care in order to improve or restore emotional, physical, spiritual, interpersonal, and financial health. Spirituality:   The focus of this group is to discuss how one's spirituality can aide in recovery.    Participation Level:  Active  Participation Quality:  Appropriate and Attentive  Affect:  Appropriate  Cognitive:  Alert, Appropriate, and Oriented  Insight: Appropriate  Engagement in Group:  Developing/Improving and Engaged  Modes of Intervention:  Activity, Discussion, and Education  Additional Comments:    Sincere Liuzzi 06/22/2023, 10:12 AM

## 2023-06-22 NOTE — Plan of Care (Signed)
   Problem: Education: Goal: Emotional status will improve Outcome: Progressing Goal: Mental status will improve Outcome: Progressing   Problem: Activity: Goal: Sleeping patterns will improve Outcome: Progressing

## 2023-06-22 NOTE — Group Note (Signed)
 [facility] LCSW Group Therapy Note   Group Date: 06/22/2023 Start Time: 1400 End Time: 1500  Type of Therapy and Topic:  Group Therapy:  Feelings around Relapse and Recovery  Participation Level:  Minimal    Description of Group:    Patients in this group will discuss emotions they experience before and after a relapse. They will process how experiencing these feelings, or avoidance of experiencing them, relates to having a relapse. Facilitator will guide patients to explore emotions they have related to recovery. Patients will be encouraged to process which emotions are more powerful. They will be guided to discuss the emotional reaction significant others in their lives may have to patients' relapse or recovery. Patients will be assisted in exploring ways to respond to the emotions of others without this contributing to a relapse.  Therapeutic Goals: Patient will identify two or more emotions that lead to relapse for them:  Patient will identify two emotions that result when they relapse:  Patient will identify two emotions related to recovery:  Patient will demonstrate ability to communicate their needs through discussion and/or role plays.   Summary of Patient Progress: Patient was present for the majority of the group. She participated in the icebreaker but did not contribute further to the conversation. Pt sat with her eyes closed throughout most of the discussion. Insight into herself and topic remain questionable.    Therapeutic Modalities:   Cognitive Behavioral Therapy Solution-Focused Therapy Assertiveness Training Relapse Prevention Therapy   Glenis Smoker, LCSW

## 2023-06-22 NOTE — Progress Notes (Signed)
 [facility] MD Progress Note  06/22/2023 5:40 PM Gabrielle Rich  MRN:  [id number]  18 year old never married African-American female with no reported psychiatric diagnoses, no reported medical history brought to the emergency department under IVC with chief complaints of suicidal ideations, recent suicide attempt where she took 10 Xanax she got from a friend, then [place] railroad [place] where a bystander saw and contacted Patent examiner.   Subjective:  Case discussed during AM multidisciplinary team meeting, vitals and notes reviewed. No reported behavioral issues overnight. Pt continues to do well. Reports good sleep and appetite. No concerns at this time. She denies depressed mood, anxiety, SI/Hi and AVH. Alert and oriented x4. Affect is euthymic. Insight and judgement fair. No other concerns at this time. Med compliant, denies med side effects.   SW team has made contact with the pts grandmother: 'Gabrielle Rich received call from pt's grandmother, Gabrielle Rich). Gabrielle Rich and Gabrielle Rich discussed pt progress. Gabrielle Rich shared that she felt pt is getting back to herself. She shared that previously pt was getting frustrated and agitated very easily. Grandmother shared that even when she visited on Monday pt was beginning to look better and appeared more settled. Gabrielle Rich stated that prior to coming to the hospital pt seemed like she just didn't want to be here anymore. However, Gabrielle Rich stated that now pt looks a lot better and appeared to be getting better. She shared that pt does have some issues with impatience and getting bored easily. Gabrielle Rich asked for an update regarding pt progress. Gabrielle Rich updated her that pt had mentioned trying to go back to school and appeared goal-oriented (also focused on a interview for a second job that she was excited about). Gabrielle Rich expressed gratitude for the help that pt has been receiving while here in the hospital. No other concerns expressed. Contact ended without incident. '  Principal  Problem: MDD (major depressive disorder) Diagnosis: Principal Problem:   MDD (major depressive disorder)  Total Time spent with patient: 20 minutes  Past Psychiatric History:  Patient denies hx of mental health evaluation Current caregiver:  Patient is own guardian/ care giver Past hospitalizations:  denies  Medication trials : denies  Suicide attempts: x3, all by OD on Xanax (unprescribed), last was at 18 yrs old, reports that she was never hospitalized, never mentioned to anyone Patient denies ever having an Act/CST team. Denies ECT, Clozaril treatments.  Past Medical History:  Past Medical History:  Diagnosis Date   Depression    Substance abuse (HCC)     Past Surgical History:  Procedure Laterality Date   LAPAROSCOPIC APPENDECTOMY N/A 11/15/2020   Procedure: APPENDECTOMY LAPAROSCOPIC;  Surgeon: Leonia Corona, MD;  Location: [facility] OR;  Service: Pediatrics;  Laterality: N/A;   Family History: History reviewed. No pertinent family history. Family Psychiatric  History: Denies family hx of mental illness, suicide. Unknown family hx of substance use  Social History:  Social History   Substance and Sexual Activity  Alcohol Use Not Currently     Social History   Substance and Sexual Activity  Drug Use Yes   Frequency: 7.0 times per week   Types: Marijuana   Comment: daily, 2-3 blunts    Social History   Socioeconomic History   Marital status: Single    Spouse name: Not on file   Number of children: 0   Years of education: Not on file   Highest education level: 9th grade  Occupational History   Occupation: Visual merchandiser part time  Tobacco Use  Smoking status: Never   Smokeless tobacco: Never  Vaping Use   Vaping status: Every Day   Substances: Nicotine   Devices: e-cig  Substance and Sexual Activity   Alcohol use: Not Currently   Drug use: Yes    Frequency: 7.0 times per week    Types: Marijuana    Comment: daily, 2-3 blunts   Sexual activity: Yes     Birth control/protection: Injection    Comment: last admin 4 mons ago  Other Topics Concern   Not on file  Social History Narrative   Was living with grandparents and 2 yr old brother until a month ago, moved in with her mother, and 2 siblings ages 19 and 9 yrs old. Reports that CPS is currently involved with family   Social Drivers of Health   Financial Resource Strain: Low Risk  (05/03/2023)   Received from [facility]   Overall Financial Resource Strain (CARDIA)    Difficulty of Paying Living Expenses: Not hard at all  Food Insecurity: No Food Insecurity (06/19/2023)   Hunger Vital Sign    Worried About Running Out of Food in the Last Year: Never true    Ran Out of Food in the Last Year: Never true  Transportation Needs: No Transportation Needs (06/19/2023)   PRAPARE - Administrator, Civil Service (Medical): No    Lack of Transportation (Non-Medical): No  Physical Activity: Not on file  Stress: Not on file  Social Connections: Not on file   Additional Social History:                         Sleep: Good  Appetite:  Good  Current Medications: Current Facility-Administered Medications  Medication Dose Route Frequency Provider Last Rate Last Admin   acetaminophen (TYLENOL) tablet 650 mg  650 mg Oral Q6H PRN Gabrielle Rode, PA-C       alum & mag hydroxide-simeth (MAALOX/MYLANTA) 200-200-20 MG/5ML suspension 30 mL  30 mL Oral Q4H PRN Gabrielle Nissan, PA-C       haloperidol (HALDOL) tablet 5 mg  5 mg Oral TID PRN Gabrielle Nelles, PA-C       And   diphenhydrAMINE (BENADRYL) capsule 50 mg  50 mg Oral TID PRN Gabrielle Whittier, PA-C       haloperidol lactate (HALDOL) injection 5 mg  5 mg Intramuscular TID PRN Gabrielle Goslin, PA-C       And   diphenhydrAMINE (BENADRYL) injection 50 mg  50 mg Intramuscular TID PRN Gabrielle Prichard, PA-C       And   LORazepam (ATIVAN) injection 2 mg  2 mg Intramuscular TID PRN Gabrielle Rich,  Gabrielle Lansdowne, PA-C       haloperidol lactate (HALDOL) injection 10 mg  10 mg Intramuscular TID PRN Gabrielle Brose, PA-C       And   diphenhydrAMINE (BENADRYL) injection 50 mg  50 mg Intramuscular TID PRN Xena Propst, PA-C       And   LORazepam (ATIVAN) injection 2 mg  2 mg Intramuscular TID PRN Veronica Fretz, PA-C       magnesium hydroxide (MILK OF MAGNESIA) suspension 30 mL  30 mL Oral Daily PRN Hydee Fleece, PA-C       melatonin tablet 2.5 mg  2.5 mg Oral QHS Ryszard Socarras, PA-C   2.5 mg at 06/21/23 2122   sertraline (ZOLOFT) tablet 25 mg  25 mg Oral Daily Jearlene Bridwell, PA-C   25 mg at 06/22/23 [phone]  traZODone (DESYREL) tablet 50 mg  50 mg Oral QHS Abella Shugart, PA-C   50 mg at 06/21/23 2122   white petrolatum (VASELINE) gel   Topical PRN Sosha Shepherd, Judeth Cornfield, PA-C        Lab Results:  No results found for this or any previous visit (from the past 48 hours).   Blood Alcohol level:  Lab Results  Component Value Date   ETH <10 06/18/2023    Metabolic Disorder Labs: Lab Results  Component Value Date   HGBA1C 4.9 06/20/2023   MPG 94 06/20/2023   No results found for: "PROLACTIN" Lab Results  Component Value Date   CHOL 187 06/20/2023   TRIG 41 06/20/2023   HDL 54 06/20/2023   CHOLHDL 3.5 06/20/2023   VLDL 8 06/20/2023   LDLCALC 125 (H) 06/20/2023    Physical Findings: AIMS:  , ,  ,  ,    CIWA:    COWS:     Musculoskeletal: Strength & Muscle Tone: within normal limits Gait & Station: normal Patient leans: N/A  Psychiatric Specialty Exam:  Presentation  General Appearance:  Appropriate for Environment  Eye Contact: Good  Speech: Normal Rate  Speech Volume: Normal  Handedness:No data recorded  Mood and Affect  Mood: Euthymic  Affect: Appropriate   Thought Process  Thought Processes: Coherent  Descriptions of Associations:Intact  Orientation:Full (Time, Place and Person)  Thought  Content:Logical  History of Schizophrenia/Schizoaffective disorder:No  Duration of Psychotic Symptoms:No data recorded Hallucinations:No data recorded  Ideas of Reference:None  Suicidal Thoughts:No data recorded  Homicidal Thoughts:No data recorded   Sensorium  Memory: Immediate Good; Recent Good; Remote Good  Judgment: Good  Insight: Good   Executive Functions  Concentration: Good  Attention Span: Good  Recall: Good  Fund of Knowledge:No data recorded Language: Good   Psychomotor Activity  Psychomotor Activity: No data recorded   Assets  Assets: Physical Health; Housing; Social Support; Desire for Improvement   Sleep  Sleep: No data recorded    Physical Exam: Physical Exam Vitals and nursing note reviewed.  Constitutional:      Appearance: Normal appearance.  HENT:     Head: Normocephalic and atraumatic.  Eyes:     Pupils: Pupils are equal, round, and reactive to light.  Pulmonary:     Effort: Pulmonary effort is normal.  Musculoskeletal:        General: Normal range of motion.     Cervical back: Normal range of motion.  Skin:    General: Skin is dry.  Neurological:     General: No focal deficit present.     Mental Status: She is alert and oriented to person, place, and time.  Psychiatric:        Attention and Perception: Attention and perception normal.        Mood and Affect: Mood and affect normal.        Speech: Speech normal.        Behavior: Behavior normal. Behavior is cooperative.        Thought Content: Thought content normal.        Cognition and Memory: Cognition and memory normal.        Judgment: Judgment normal.    Review of Systems  All other systems reviewed and are negative.  Blood pressure 107/71, pulse 63, temperature 98.1 F (36.7 C), resp. rate 17, height 5\' 3"  (1.6 m), weight 41.7 kg, last menstrual period 10/26/2022, SpO2 100%, unknown if currently breastfeeding. Body mass index is 16.3  kg/m.  Treatment Plan Summary:  1.    Safety and Monitoring:   --  Involuntary admission to inpatient psychiatric unit for safety, stabilization and treatment -- Daily contact with patient to assess and evaluate symptoms and progress in treatment -- Patient's case to be discussed in multi-disciplinary team meeting -- Observation Level : q15 minute checks -- Vital signs:  q12 hours -- Precautions: suicide   2. Psychiatric Diagnoses and Treatment:    -- Continue Zoloft 25 mg daily for MDD -- Plan for discharge tomorrow    --  The risks/benefits/side-effects/alternatives to this medication were discussed in detail with the patient and time was given for questions. The patient consents to medication trial.  -- Metabolic profile and EKG monitoring obtained while on an atypical antipsychotic  (BMI: 16.30; Lipid Panel: unremarkable;  HbgA1c: 4.9; QTc: pending)  -- Encouraged patient to participate in unit milieu and in scheduled group therapies  -- Short Term Goals: Ability to identify changes in lifestyle to reduce recurrence of condition will improve, Ability to verbalize feelings will improve, Ability to disclose and discuss suicidal ideas, Ability to demonstrate self-control will improve, Ability to identify and develop effective coping behaviors will improve, Ability to maintain clinical measurements within normal limits will improve, Compliance with prescribed medications will improve, and Ability to identify triggers associated with substance abuse/mental health issues will improve -- Long Term Goals: Improvement in symptoms so as ready for discharge        3. Medical Issues Being Addressed:                                       --- Home med Depo Provera last administered 4 months ago, pt unable to tell last LMP.  uPT negative on 06/18/23, recommend follow up with outpatient provider                Nicotine dependence             -- Nicotine gum 2mg  PRN             -- Smoking  cessation encouraged               Marijuana dependence             -- Counseling on risks of continued use.    4. Discharge Planning:   -- Social work and case management to assist with discharge planning and identification of hospital follow-up needs prior to discharge -- Estimated LOS: 5-7 days -- Discharge Concerns: Need to establish a safety plan; Medication compliance and effectiveness -- Discharge Goals: Return home with outpatient referrals for mental health follow-up including medication management/psychotherapy   Paulene Floor, PA-C 06/22/2023, 5:40 PM

## 2023-06-22 NOTE — Plan of Care (Signed)

## 2023-06-22 NOTE — Group Note (Signed)
 Date:  06/22/2023 Time:  11:24 PM  Group Topic/Focus:  Wrap-Up Group:   The focus of this group is to help patients review their daily goal of treatment and discuss progress on daily workbooks.    Participation Level:  Active  Participation Quality:  Appropriate, Attentive, Sharing, and Supportive  Affect:  Appropriate  Cognitive:  Appropriate  Insight: Appropriate and Good  Engagement in Group:  Supportive  Modes of Intervention:  Discussion and Support  Additional Comments:     Belva Crome 06/22/2023, 11:24 PM

## 2023-06-23 ENCOUNTER — Other Ambulatory Visit: Payer: Self-pay

## 2023-06-23 MED ORDER — TRAZODONE HCL 50 MG PO TABS
50.0000 mg | ORAL_TABLET | Freq: Every day | ORAL | 0 refills | Status: AC
  Filled 2023-06-23: qty 30, 30d supply, fill #0

## 2023-06-23 MED ORDER — SERTRALINE HCL 25 MG PO TABS
25.0000 mg | ORAL_TABLET | Freq: Every day | ORAL | 0 refills | Status: AC
  Filled 2023-06-23: qty 30, 30d supply, fill #0

## 2023-06-23 MED ORDER — MELATONIN 5 MG PO TABS
2.5000 mg | ORAL_TABLET | Freq: Every day | ORAL | 0 refills | Status: AC
Start: 2023-06-23 — End: ?
  Filled 2023-06-23: qty 15, 30d supply, fill #0

## 2023-06-23 NOTE — Progress Notes (Signed)
   06/23/23 0900  Psych Admission Type (Psych Patients Only)  Admission Status Involuntary  Psychosocial Assessment  Patient Complaints None  Eye Contact Other (Comment) (WNL)  Facial Expression Animated  Affect Appropriate to circumstance  Speech Logical/coherent  Interaction Minimal  Motor Activity Slow  Appearance/Hygiene Unremarkable  Behavior Characteristics Cooperative  Mood Pleasant  Thought Process  Coherency WDL  Content WDL  Delusions WDL  Perception WDL  Hallucination None reported or observed  Judgment WDL  Confusion WDL  Danger to Self  Current suicidal ideation? Denies  Agreement Not to Harm Self Yes  Description of Agreement verbal

## 2023-06-23 NOTE — Plan of Care (Signed)
   Problem: Education: Goal: Knowledge of [facility] information/materials will improve Outcome: Progressing Goal: Emotional status will improve Outcome: Progressing Goal: Mental status will improve Outcome: Progressing

## 2023-06-23 NOTE — Group Note (Signed)
 Date:  06/23/2023 Time:  10:22 AM  Group Topic/Focus:  Goals Group:   The focus of this group is to help patients establish daily goals to achieve during treatment and discuss how the patient can incorporate goal setting into their daily lives to aide in recovery.    Participation Level:  Active  Participation Quality:  Appropriate  Affect:  Appropriate  Cognitive:  Appropriate  Insight: Appropriate  Engagement in Group:  Engaged  Modes of Intervention:  Discussion, Education, and Support  Additional Comments:    Gabrielle Rich A Gabrielle Rich 06/23/2023, 10:22 AM

## 2023-06-23 NOTE — Progress Notes (Signed)
 Discharge Note:  Patient denies SI/HI/AVH at this time. Discharge instructions, AVS, prescriptions, and transition record gone over with patient. Patient agrees to comply with medication management, follow-up visit, and outpatient therapy. Patient belongings returned to patient. Patient questions and concerns addressed and answered. Patient ambulatory off unit. Patient discharged to home to grandparents. Aunt picked patient up from BMU

## 2023-06-23 NOTE — Discharge Summary (Signed)
 Physician Discharge Summary Note  Patient:  Gabrielle Rich is an 18 y.o., female MRN:  [id number] DOB:  09/21/2005 Patient phone:  [phone] (home)  Patient address:   [address] [place] [address],  Total Time spent with patient: 30 minutes  Date of Admission:  06/19/2023 Date of Discharge: 06/23/2023  Reason for Admission:   18 year old never married African-American female with no reported psychiatric diagnoses, no reported medical history brought to the emergency department under IVC with chief complaints of suicidal ideations, recent suicide attempt where she took 66 Xanax she got from a friend, then went [place] the railroad tracks where a bystander saw and contacted Patent examiner. Patient reports that she has been struggling with depression on and off since childhood, she reports multiple suicide attempts during her teenage years all by attempting to overdose on Xanax . Admitted under IVC petition.   Principal Problem: MDD (major depressive disorder) Discharge Diagnoses: Principal Problem:   MDD (major depressive disorder)   Past Psychiatric History:  Patient denies hx of mental health evaluation Current caregiver:  Patient is own guardian/ care giver Past hospitalizations:  denies  Medication trials : denies  Suicide attempts: x3, all by OD on Xanax (unprescribed), last was at 18 yrs old, reports that she was never hospitalized, never mentioned to anyone Patient denies ever having an Act/CST team. Denies ECT, Clozaril treatments.  Past Medical History:  Past Medical History:  Diagnosis Date   Depression    Substance abuse (HCC)     Past Surgical History:  Procedure Laterality Date   LAPAROSCOPIC APPENDECTOMY N/A 11/15/2020   Procedure: APPENDECTOMY LAPAROSCOPIC;  Surgeon: Leonia Corona, MD;  Location: [facility] OR;  Service: Pediatrics;  Laterality: N/A;   Family History: History reviewed. No pertinent family history. Family Psychiatric  History: Denies  family hx of mental illness, suicide. Unknown family hx of substance use  Social History:  Social History   Substance and Sexual Activity  Alcohol Use Not Currently     Social History   Substance and Sexual Activity  Drug Use Yes   Frequency: 7.0 times per week   Types: Marijuana   Comment: daily, 2-3 blunts    Social History   Socioeconomic History   Marital status: Single    Spouse name: Not on file   Number of children: 0   Years of education: Not on file   Highest education level: 9th grade  Occupational History   Occupation: Visual merchandiser part time  Tobacco Use   Smoking status: Never   Smokeless tobacco: Never  Vaping Use   Vaping status: Every Day   Substances: Nicotine   Devices: e-cig  Substance and Sexual Activity   Alcohol use: Not Currently   Drug use: Yes    Frequency: 7.0 times per week    Types: Marijuana    Comment: daily, 2-3 blunts   Sexual activity: Yes    Birth control/protection: Injection    Comment: last admin 4 mons ago  Other Topics Concern   Not on file  Social History Narrative   Was living with grandparents and 18 yr old brother until a month ago, moved in with her mother, and 2 siblings ages 43 and 52 yrs old. Reports that CPS is currently involved with family   Social Drivers of Health   Financial Resource Strain: Low Risk  (05/03/2023)   Received from [facility]   Overall Financial Resource Strain (CARDIA)    Difficulty of Paying Living Expenses: Not hard  at all  Food Insecurity: No Food Insecurity (06/19/2023)   Hunger Vital Sign    Worried About Running Out of Food in the Last Year: Never true    Ran Out of Food in the Last Year: Never true  Transportation Needs: No Transportation Needs (06/19/2023)   PRAPARE - Administrator, Civil Service (Medical): No    Lack of Transportation (Non-Medical): No  Physical Activity: Not on file  Stress: Not on file  Social Connections: Not on file     Hospital Course:   During the course of hospitalization, pt received daily multiple modalities of treatments consisting of Psychopharmacology, individual, group, psychoeducational, recreational, milieu therapy, including case management to coordinate pts inpatient and outpatient care and in concert with weekly treatment team meetings. Discharge planning was initiated on the day of admission to ensure a safe discharge. The presenting symptoms were closely monitored and medications were started as indicated. There were no complications. The principal reasons for hospitalization consisted of MDD, suicide attempt  Medications addressing the principal problem were initiated with improvement in severity sufficient to discharge to a lower level of care. Patient was started on Zoloft 25 mg daily for depressive symptoms and anxiety, Trazodone 50 mg HS for depression adjunct and sleep, Melatonin 2.5 mg HS for sleep, throughout the course of her stay on the unit.   It is intended for the outpatient provider to determine whether to continue these medications, or if these medication needs to be titrated for continued outpatient therapy.  All identified psychiatric, general medical/surgical psychosocial obstacles to discharge were addressed. Patient tolerated these medications with no noted side effects. All these medications were titrated to discharge levels (Please see discharge medications below). Patient showed slow but steady and sustained symptomatic improvement before discharge. The patient denied suicidal, homicidal ideations and hallucinations. Family session held to determine baseline behaviors and for safe discharge plan. Contacted the pts grandmother, Lafonda Mosses [phone], reports that the pt appears to be at baseline, has no concerns for safety, is agreeable with discharge. Safety precautions discussed.   On the day of discharge 06/23/2023, following sustained improvement in the affect of this patient,  continued report of euthymic mood, repeated denial of suicidal, homicidal and other violent ideations, adequate interaction with peers, active participation in groups while on the unit, and denial of adverse reactions from the medications, the treatment team decided that Gabrielle Rich was stable for discharge back  home with family member, and scheduled mental health treatment as below. A comprehensive risk assessment was done prior to discharge and shows that patient is at low risk for suicide or violence and will continue to be if patient complies with the treatment recommendations, medications and therapy. Pt no longer meeting criteria for IV as she is not currently an imminent danger to self or others. Patient agrees to call Crisis Services, 911 and/or return to the ED if safety cannot be maintained outside the hospital setting. Discharge medications reviewed with patient, explanation of indication, risks/benefits and side effects profiles. The patient verbalized understanding and is in agreement with the discharge plan.   Physical Findings: AIMS:  , ,  ,  ,    CIWA:    COWS:     Musculoskeletal: Strength & Muscle Tone: within normal limits Gait & Station: normal Patient leans: N/A   Psychiatric Specialty Exam:  Presentation  General Appearance:  Appropriate for Environment  Eye Contact: Good  Speech: Normal Rate  Speech Volume: Normal  Handedness:No data recorded  Mood  and Affect  Mood: Euthymic  Affect: Appropriate   Thought Process  Thought Processes: Coherent  Descriptions of Associations:Intact  Orientation:Full (Time, Place and Person)  Thought Content:Logical  History of Schizophrenia/Schizoaffective disorder:No  Duration of Psychotic Symptoms:No data recorded Hallucinations:Hallucinations: None  Ideas of Reference:None  Suicidal Thoughts:Suicidal Thoughts: No  Homicidal Thoughts:Homicidal Thoughts: No   Sensorium  Memory: Immediate Good;  Recent Good; Remote Good  Judgment: Fair  Insight: Good   Executive Functions  Concentration: Good  Attention Span: Good  Recall: Good  Fund of Knowledge: Good  Language: Good   Psychomotor Activity  Psychomotor Activity: Psychomotor Activity: Normal   Assets  Assets: Housing; Health and safety inspector; Desire for Improvement; Communication Skills; Physical Health; Social Support; Transportation   Sleep  Sleep: Sleep: Good    Physical Exam: Physical Exam Vitals and nursing note reviewed.  HENT:     Head: Normocephalic and atraumatic.     Nose: Nose normal.     Mouth/Throat:     Mouth: Mucous membranes are moist.  Eyes:     Extraocular Movements: Extraocular movements intact.  Pulmonary:     Effort: Pulmonary effort is normal.  Musculoskeletal:        General: Normal range of motion.     Cervical back: Normal range of motion.  Skin:    General: Skin is warm and dry.  Neurological:     Mental Status: She is alert and oriented to person, place, and time.  Psychiatric:        Attention and Perception: Attention and perception normal.        Mood and Affect: Mood and affect normal.        Speech: Speech normal.        Behavior: Behavior normal. Behavior is cooperative.        Thought Content: Thought content normal.        Cognition and Memory: Cognition and memory normal.     Comments: Judgment fair     Review of Systems  All other systems reviewed and are negative.  Blood pressure 108/70, pulse 69, temperature 98.4 F (36.9 C), resp. rate 20, height 5\' 3"  (1.6 m), weight 41.7 kg, last menstrual period 10/26/2022, SpO2 98%, unknown if currently breastfeeding. Body mass index is 16.3 kg/m.   Social History   Tobacco Use  Smoking Status Never  Smokeless Tobacco Never   Tobacco Cessation:  N/A, patient does not currently use tobacco products   Blood Alcohol level:  Lab Results  Component Value Date   ETH <10 06/18/2023     Metabolic Disorder Labs:  Lab Results  Component Value Date   HGBA1C 4.9 06/20/2023   MPG 94 06/20/2023   No results found for: "PROLACTIN" Lab Results  Component Value Date   CHOL 187 06/20/2023   TRIG 41 06/20/2023   HDL 54 06/20/2023   CHOLHDL 3.5 06/20/2023   VLDL 8 06/20/2023   LDLCALC 125 (H) 06/20/2023    See Psychiatric Specialty Exam and Suicide Risk Assessment completed by Attending Physician prior to discharge.  Discharge destination:  Other:  home with family   Is patient on multiple antipsychotic therapies at discharge:  No   Has Patient had three or more failed trials of antipsychotic monotherapy by history:  No  Recommended Plan for Multiple Antipsychotic Therapies: NA  Discharge Instructions     Diet general   Complete by: As directed    Increase activity slowly   Complete by: As directed  Allergies as of 06/23/2023   No Known Allergies      Medication List     STOP taking these medications    acetaminophen 500 MG tablet Commonly known as: TYLENOL   ibuprofen 200 MG tablet Commonly known as: ADVIL       TAKE these medications      Indication  medroxyPROGESTERone 150 MG/ML injection Commonly known as: DEPO-PROVERA Inject 150 mg into the muscle every 3 (three) months.  Indication: Birth Control Treatment   melatonin 5 MG Tabs Take 0.5 tablets (2.5 mg total) by mouth at bedtime.  Indication: Trouble Sleeping   sertraline 25 MG tablet Commonly known as: ZOLOFT Take 1 tablet (25 mg total) by mouth daily. Start taking on: June 24, 2023  Indication: Generalized Anxiety Disorder, Major Depressive Disorder   traZODone 50 MG tablet Commonly known as: DESYREL Take 1 tablet (50 mg total) by mouth at bedtime.  Indication: Trouble Sleeping        Follow-up Information     [facility], [facility]. Go to.   Why: In person appointment made 06/27/23 at 8 AM. Contact information: [address] [place]  [address] [phone]                 Follow-up recommendations:    # It is recommended to the patient to continue psychiatric medications as prescribed, after discharge from the hospital.   # It is recommended to the patient to follow up with your outpatient psychiatric provider and PCP. # It was discussed with the patient, the impact of alcohol, drugs, tobacco have been there overall psychiatric and medical wellbeing, and total abstinence from substance use was recommended. # Prescriptions provided or sent directly to preferred pharmacy at discharge. Patient agreeable to plan. Given the opportunity to ask questions. Appears to feel comfortable with discharge.  # In the event of worsening symptoms, the patient is instructed to call the crisis hotline (988), 911 and or go to the nearest ED for appropriate evaluation and treatment of symptoms. To follow-up with primary care provider for other medical issues, concerns and or health care needs # Patient was discharged home to her mother's home with a plan to follow up as noted above.    SignedPaulene Floor, PA-C 06/23/2023, 10:01 AM

## 2023-06-23 NOTE — Progress Notes (Signed)
  [facility] Adult Case Management Discharge Plan :  Will you be returning to the same living situation after discharge:  Yes,  pt reports that she is returning to her home.  At discharge, do you have transportation home?: Yes,  pt reports that family will provide transportation. Do you have the ability to pay for your medications: Yes,  [place] MEDICAID PREPAID HEALTH PLAN / [place] MEDICAID [facility]  Release of information consent forms completed and in the chart;  Patient's signature needed at discharge.  Patient to Follow up at:  Follow-up Information     [facility], [facility]. Go to.   Why: In person appointment made 06/27/23 at 8 AM. Contact information: [address] [place] [address] [phone]                 Next level of care provider has access to [facility] Link:no  Safety Planning and Suicide Prevention discussed: Yes,  SPE completed with the patient and with patients family     Has patient been referred to the Quitline?: Patient refused referral for treatment  Patient has been referred for addiction treatment: No known substance use disorder.  Harden Mo, LCSW 06/23/2023, 9:53 AM

## 2023-06-23 NOTE — Plan of Care (Signed)
  Problem: Education: Goal: Knowledge of [place] [facility] Education information/materials will improve Outcome: Progressing Goal: Emotional status will improve Outcome: Progressing Goal: Mental status will improve Outcome: Progressing Goal: Verbalization of understanding the information provided will improve Outcome: Progressing   Problem: Activity: Goal: Interest or engagement in activities will improve Outcome: Progressing Goal: Sleeping patterns will improve Outcome: Progressing   Problem: Coping: Goal: Ability to verbalize frustrations and anger appropriately will improve Outcome: Progressing Goal: Ability to demonstrate self-control will improve Outcome: Progressing   Problem: Health Behavior/Discharge Planning: Goal: Identification of resources available to assist in meeting health care needs will improve Outcome: Progressing Goal: Compliance with treatment plan for underlying cause of condition will improve Outcome: Progressing   Problem: Physical Regulation: Goal: Ability to maintain clinical measurements within normal limits will improve Outcome: Progressing   Problem: Safety: Goal: Periods of time without injury will increase Outcome: Progressing   Problem: Education: Goal: Ability to make informed decisions regarding treatment will improve Outcome: Progressing   Problem: Coping: Goal: Coping ability will improve Outcome: Progressing   Problem: Health Behavior/Discharge Planning: Goal: Identification of resources available to assist in meeting health care needs will improve Outcome: Progressing   Problem: Medication: Goal: Compliance with prescribed medication regimen will improve Outcome: Progressing   Problem: Self-Concept: Goal: Ability to disclose and discuss suicidal ideas will improve Outcome: Progressing Goal: Will verbalize positive feelings about self Outcome: Progressing Note: Patient is on track. Patient will avoid flare triggers     Problem: Education: Goal: Utilization of techniques to improve thought processes will improve Outcome: Progressing Goal: Knowledge of the prescribed therapeutic regimen will improve Outcome: Progressing   Problem: Activity: Goal: Interest or engagement in leisure activities will improve Outcome: Progressing Goal: Imbalance in normal sleep/wake cycle will improve Outcome: Progressing   Problem: Coping: Goal: Coping ability will improve Outcome: Progressing Goal: Will verbalize feelings Outcome: Progressing   Problem: Health Behavior/Discharge Planning: Goal: Ability to make decisions will improve Outcome: Progressing Goal: Compliance with therapeutic regimen will improve Outcome: Progressing   Problem: Role Relationship: Goal: Will demonstrate positive changes in social behaviors and relationships Outcome: Progressing   Problem: Safety: Goal: Ability to disclose and discuss suicidal ideas will improve Outcome: Progressing Goal: Ability to identify and utilize support systems that promote safety will improve Outcome: Progressing   Problem: Self-Concept: Goal: Will verbalize positive feelings about self Outcome: Progressing Goal: Level of anxiety will decrease Outcome: Progressing

## 2023-06-23 NOTE — Group Note (Signed)
 Recreation Therapy Group Note   Group Topic:Healthy Support Systems  Group Date: 06/23/2023 Start Time: 1000 End Time: 1110 Facilitators: Rosina Lowenstein, LRT, CTRS Location:  Craft Room  Group Description: Straw Bridge. In groups or individually, patients were given 10 plastic drinking straws and an equal length of masking tape. Using the materials provided, patients were instructed to build a free-standing bridge-like structure to suspend an everyday item (ex: deck of cards) off the floor or table surface. All materials were required to be used in Secondary school teacher. LRT facilitated post-activity discussion reviewing the importance of having strong and healthy support systems in our lives. LRT discussed how the people in our lives serve as the tape and the deck of cards we placed on top of our straw structure are the stressors we face in daily life. LRT and pts discussed what happens in our life when things get too heavy for [place], and we don't have strong supports outside of the hospital. Pt shared 2 of their healthy supports in their life aloud in the group.   Goal Area(s) Addressed:  Patient will identify 2 healthy supports in their life. Patient will identify skills to successfully complete activity. Patient will identify correlation of this activity to life post-discharge.  Patient will build on frustration tolerance skills. Patient will increase team building and communication skills.    Affect/Mood: Appropriate   Participation Level: Active and Engaged   Participation Quality: Independent   Behavior: Appropriate, Calm, and Cooperative   Speech/Thought Process: Coherent   Insight: Good   Judgement: Good   Modes of Intervention: Guided Discussion, Problem-solving, and STEM Activity   Patient Response to Interventions:  Attentive, Engaged, Interested , and Receptive   Education Outcome:  Acknowledges education   Clinical Observations/Individualized Feedback: Gabrielle Rich was active in  their participation of session activities and group discussion. Pt identified "my grandparents and music" as her healthy supports. Pt shared that she enjoys doing things like this and that she loved the group. Pt successfully completed a free standing straw structure. Pt interacted well with LRT and peers duration of session.    Plan: Continue to engage patient in RT group sessions 2-3x/week.   Rosina Lowenstein, LRT, CTRS 06/23/2023 11:36 AM

## 2023-06-23 NOTE — [facility] Suicide Risk Assessment (Signed)
 Suicide Risk Assessment  Discharge Assessment    [facility] Discharge Suicide Risk Assessment   Principal Problem: MDD (major depressive disorder) Discharge Diagnoses: Principal Problem:   MDD (major depressive disorder)   Total Time spent with patient: 30 minutes  Musculoskeletal: Strength & Muscle Tone: within normal limits Gait & Station: normal Patient leans: N/A  Psychiatric Specialty Exam  Presentation  General Appearance:  Appropriate for Environment  Eye Contact: Good  Speech: Normal Rate  Speech Volume: Normal  Handedness:No data recorded  Mood and Affect  Mood: Euthymic  Duration of Depression Symptoms: Greater than two weeks  Affect: Appropriate   Thought Process  Thought Processes: Coherent  Descriptions of Associations:Intact  Orientation:Full (Time, Place and Person)  Thought Content:Logical  History of Schizophrenia/Schizoaffective disorder:No  Duration of Psychotic Symptoms:No data recorded Hallucinations:Hallucinations: None  Ideas of Reference:None  Suicidal Thoughts:Suicidal Thoughts: No  Homicidal Thoughts:Homicidal Thoughts: No   Sensorium  Memory: Immediate Good; Recent Good; Remote Good  Judgment: Fair  Insight: Good   Executive Functions  Concentration: Good  Attention Span: Good  Recall: Good  Fund of Knowledge: Good  Language: Good   Psychomotor Activity  Psychomotor Activity: Psychomotor Activity: Normal   Assets  Assets: Housing; Health and safety inspector; Desire for Improvement; Communication Skills; Physical Health; Social Support; Transportation   Sleep  Sleep: Sleep: Good   Physical Exam: Physical Exam Vitals and nursing note reviewed.  HENT:     Head: Normocephalic and atraumatic.     Nose: Nose normal.     Mouth/Throat:     Mouth: Mucous membranes are moist.  Eyes:     Extraocular Movements: Extraocular movements intact.  Pulmonary:     Effort: Pulmonary effort is  normal.  Musculoskeletal:        General: Normal range of motion.  Skin:    General: Skin is warm and dry.  Neurological:     General: No focal deficit present.     Mental Status: She is alert and oriented to person, place, and time. Mental status is at baseline.  Psychiatric:        Attention and Perception: Attention and perception normal.        Mood and Affect: Mood and affect normal.        Speech: Speech normal.        Behavior: Behavior normal. Behavior is cooperative.        Thought Content: Thought content normal.        Cognition and Memory: Cognition and memory normal.        Judgment: Judgment normal.    Review of Systems  All other systems reviewed and are negative.  Blood pressure 108/70, pulse 69, temperature 98.4 F (36.9 C), resp. rate 20, height 5\' 3"  (1.6 m), weight 41.7 kg, last menstrual period 10/26/2022, SpO2 98%, unknown if currently breastfeeding. Body mass index is 16.3 kg/m.  Mental Status Per Nursing Assessment::   On Admission:  NA  Demographic Factors:  Adolescent or young adult  Loss Factors: NA  Historical Factors: Prior suicide attempts and Impulsivity  Risk Reduction Factors:   Employed, Living with another person, especially a relative, and Positive social support  Continued Clinical Symptoms:  None   Cognitive Features That Contribute To Risk:  None    Suicide Risk:  Minimal: No identifiable suicidal ideation.  Patients presenting with no risk factors but with morbid ruminations; may be classified as minimal risk based on the severity of the depressive symptoms   Follow-up Information  [facility]. Go to.   Why: In person appointment made 06/27/23 at 8 AM. Contact information: [address] [place] [address] [phone]                 Plan Of Care/Follow-up recommendations:  # It is recommended to the patient to continue psychiatric medications as prescribed, after discharge from the  hospital.   # It is recommended to the patient to follow up with your outpatient psychiatric provider and PCP. # It was discussed with the patient, the impact of alcohol, drugs, tobacco have been there overall psychiatric and medical wellbeing, and total abstinence from substance use was recommended. # Prescriptions provided or sent directly to preferred pharmacy at discharge. Patient agreeable to plan. Given the opportunity to ask questions. Appears to feel comfortable with discharge.  # In the event of worsening symptoms, the patient is instructed to call the crisis hotline (988), 911 and or go to the nearest ED for appropriate evaluation and treatment of symptoms. To follow-up with primary care provider for other medical issues, concerns and or health care needs # Patient was discharged home to her mother's home with a plan to follow up as noted above   [employer], PA-C 06/23/2023, 10:01 AM

## 2023-07-01 ENCOUNTER — Encounter: Payer: Self-pay | Admitting: Emergency Medicine

## 2023-07-01 ENCOUNTER — Emergency Department
Admission: EM | Admit: 2023-07-01 | Discharge: 2023-07-01 | Disposition: A | Discharge status: Home / Self Care | Attending: Emergency Medicine | Admitting: Emergency Medicine

## 2023-07-01 ENCOUNTER — Other Ambulatory Visit: Payer: Self-pay

## 2023-07-01 DIAGNOSIS — T50901A Poisoning by unspecified drugs, medicaments and biological substances, accidental (unintentional), initial encounter: Secondary | ICD-10-CM | POA: Diagnosis present

## 2023-07-01 LAB — COMPREHENSIVE METABOLIC PANEL
ALT: 24 U/L (ref 0–44)
AST: 34 U/L (ref 15–41)
Albumin: 4.2 g/dL (ref 3.5–5.0)
Alkaline Phosphatase: 56 U/L (ref 38–126)
Anion gap: 9 (ref 5–15)
BUN: 12 mg/dL (ref 6–20)
CO2: 29 mmol/L (ref 22–32)
Calcium: 9.1 mg/dL (ref 8.9–10.3)
Chloride: 101 mmol/L (ref 98–111)
Creatinine, Ser: 0.63 mg/dL (ref 0.44–1.00)
GFR, Estimated: >60 mL/min (ref >60)
Glucose, Bld: 78 mg/dL (ref 70–99)
Potassium: 3.9 mmol/L (ref 3.5–5.1)
Sodium: 139 mmol/L (ref 135–145)
Total Bilirubin: 0.4 mg/dL (ref 0.0–1.2)
Total Protein: 7.2 g/dL (ref 6.5–8.1)

## 2023-07-01 LAB — CBC WITH DIFFERENTIAL/PLATELET
Abs Immature Granulocytes: 0.02 10*3/uL (ref 0.00–0.07)
Basophils Absolute: 0 10*3/uL (ref 0.0–0.1)
Basophils Relative: 0 %
Eosinophils Absolute: 0.4 10*3/uL (ref 0.0–0.5)
Eosinophils Relative: 4 %
HCT: 36.6 % (ref 36.0–46.0)
Hemoglobin: 12.4 g/dL (ref 12.0–15.0)
Immature Granulocytes: 0 %
Lymphocytes Relative: 21 %
Lymphs Abs: 1.9 10*3/uL (ref 0.7–4.0)
MCH: 29.5 pg (ref 26.0–34.0)
MCHC: 33.9 g/dL (ref 30.0–36.0)
MCV: 86.9 fL (ref 80.0–100.0)
Monocytes Absolute: 0.4 10*3/uL (ref 0.1–1.0)
Monocytes Relative: 4 %
Neutro Abs: 6.1 10*3/uL (ref 1.7–7.7)
Neutrophils Relative %: 71 %
Platelets: 259 10*3/uL (ref 150–400)
RBC: 4.21 MIL/uL (ref 3.87–5.11)
RDW: 12.7 % (ref 11.5–15.5)
WBC: 8.8 10*3/uL (ref 4.0–10.5)
nRBC: 0 % (ref 0.0–0.2)

## 2023-07-01 LAB — ETHANOL: Alcohol, Ethyl (B): <10 mg/dL (ref <10)

## 2023-07-01 LAB — SALICYLATE LEVEL: Salicylate Lvl: <7 mg/dL — ABNORMAL LOW (ref 7.0–30.0)

## 2023-07-01 LAB — ACETAMINOPHEN LEVEL: Acetaminophen (Tylenol), Serum: <10 ug/mL — ABNORMAL LOW (ref 10–30)

## 2023-07-01 MED ORDER — NALOXONE HCL 4 MG/0.1ML NA LIQD: NASAL | 0 refills | Status: AC

## 2023-07-01 NOTE — Discharge Instructions (Addendum)
 Please be careful with the medications you take in the future and do not take too much.

## 2023-07-01 NOTE — ED Triage Notes (Signed)
 Patient to ED via POV with a friend for possible drug overdose unsure of what substance. Hx of xanax use. Seen on 2/22 for SI attempt by taking multiple xanax. PTA pt given nasal narcan by neighbor around 1320. Patient opening eyes to verbal stimuli. PT not speaking to RN.  Friend reports vomiting after receiving narcan and more responsive at that time.

## 2023-07-01 NOTE — ED Provider Notes (Signed)
 [facility] Provider Note    Event Date/Time   First MD Initiated Contact with Patient 07/01/23 1439     (approximate)   History   Drug Overdose   HPI  Gabrielle Rich is a 18 y.o. female presents to the emergency department with concern for drug overdose.  History is provided by the patient's friend who is at bedside.  States that she went over to her friend's house and got some medication and then came back and started to not act right.  States that she stopped breathing and her eyes were open but she was not responding.  She states that her neighbor had intranasal Narcan so she gave her intranasal Narcan and then she was responsive.  She then brought her to the emergency department.  Patient states that she took a Xanax.  Denies any suicidal ideation.  Denies any concern for pregnancy.  Denies any chest pain or shortness of breath.     Physical Exam   Triage Vital Signs: ED Triage Vitals  Encounter Vitals Group     BP 07/01/23 1423 131/74     Systolic BP Percentile --      Diastolic BP Percentile --      Pulse Rate 07/01/23 1423 (!) 107     Resp 07/01/23 1423 16     Temp --      Temp src --      SpO2 07/01/23 1423 100 %     Weight 07/01/23 1424 90 lb 6.2 oz (41 kg)     Height 07/01/23 1424 5\' 3"  (1.6 m)     Head Circumference --      Peak Flow --      Pain Score 07/01/23 1424 0     Pain Loc --      Pain Education --      Exclude from Growth Chart --     Most recent vital signs: Vitals:   07/01/23 1423  BP: 131/74  Pulse: (!) 107  Resp: 16  SpO2: 100%    Physical Exam Constitutional:      Appearance: She is well-developed.  HENT:     Head: Atraumatic.  Eyes:     Conjunctiva/sclera: Conjunctivae normal.  Cardiovascular:     Rate and Rhythm: Regular rhythm. Tachycardia present.  Pulmonary:     Effort: No respiratory distress.  Abdominal:     General: There is no distension.  Musculoskeletal:        General: Normal range of  motion.     Cervical back: Normal range of motion.  Skin:    General: Skin is warm.     Capillary Refill: Capillary refill takes less than 2 seconds.  Neurological:     Mental Status: She is alert. Mental status is at baseline.     GCS: GCS eye subscore is 4. GCS verbal subscore is 5. GCS motor subscore is 6.     Cranial Nerves: Cranial nerves 2-12 are intact.     Sensory: Sensation is intact.     Motor: Motor function is intact.     Coordination: Coordination is intact.     IMPRESSION / MDM / ASSESSMENT AND PLAN / ED COURSE  I reviewed the triage vital signs and the nursing notes.  Differential diagnosis including opioid overdose, Xanax overdose, intoxication  Patient received Narcan prior to arrival intranasally with her friend.  LABS (all labs ordered are listed, but only abnormal results are displayed) Labs interpreted as -  Labs Reviewed  COMPREHENSIVE METABOLIC PANEL  ETHANOL  URINE DRUG SCREEN, QUALITATIVE ([facility] ONLY)  CBC WITH DIFFERENTIAL/PLATELET  SALICYLATE LEVEL  ACETAMINOPHEN LEVEL  POC URINE PREG, ED     MDM  Patient is currently protecting her airway.  Normal respiratory drive.  Do not feel that she needs a repeat dose of Narcan at this time.  Will obtain lab work and continue to monitor for recurrent doses of Narcan.  Alert and oriented with a nonfocal neurologic exam.  Have a low suspicion for intracranial hemorrhage.  Clinic picture is not consistent with a meningitis.     PROCEDURES:  Critical Care performed: No  Procedures  Patient's presentation is most consistent with acute presentation with potential threat to life or bodily function.   MEDICATIONS ORDERED IN ED: Medications - No data to display  FINAL CLINICAL IMPRESSION(S) / ED DIAGNOSES   Final diagnoses:  Accidental overdose, initial encounter     Rx / DC Orders   ED Discharge Orders          Ordered    naloxone (NARCAN) nasal spray 4 mg/0.1 mL        07/01/23 1459              Note:  This document was prepared using Dragon voice recognition software and may include unintentional dictation errors.   Corena Herter, MD 07/01/23 1500

## 2023-07-01 NOTE — ED Notes (Signed)
 Pt able to eat food at this time without complaint. Pt ambulating without difficulty at this time.

## 2023-07-01 NOTE — ED Provider Notes (Signed)
-----------------------------------------   6:25 PM on 07/01/2023 -----------------------------------------   Blood pressure 108/69, pulse 78, temperature 98.2 F (36.8 C), temperature source Oral, resp. rate 12, height 5\' 3"  (1.6 m), weight 41 kg, last menstrual period 10/26/2022, SpO2 98%, unknown if currently breastfeeding.  Received patient in signout.  Here for accidental overdose.  No SI or HI.  Signed out pending metabolization.  Patient was reassessed and able to ambulate around the ED without issue.  Tolerating p.o. without issue.  Will discharge at this time.   Janith Lima, MD 07/01/23 [phone]

## 2023-09-21 ENCOUNTER — Ambulatory Visit

## 2023-10-05 ENCOUNTER — Encounter (HOSPITAL_BASED_OUTPATIENT_CLINIC_OR_DEPARTMENT_OTHER): Payer: Self-pay | Admitting: Emergency Medicine

## 2023-10-05 ENCOUNTER — Emergency Department (HOSPITAL_BASED_OUTPATIENT_CLINIC_OR_DEPARTMENT_OTHER)
Admission: EM | Admit: 2023-10-05 | Discharge: 2023-10-05 | Disposition: A | Discharge status: Home / Self Care | Payer: MEDICAID | Attending: Emergency Medicine | Admitting: Emergency Medicine

## 2023-10-05 DIAGNOSIS — T391X1A Poisoning by 4-Aminophenol derivatives, accidental (unintentional), initial encounter: Secondary | ICD-10-CM | POA: Diagnosis present

## 2023-10-05 DIAGNOSIS — F1910 Other psychoactive substance abuse, uncomplicated: Secondary | ICD-10-CM

## 2023-10-05 DIAGNOSIS — T50901A Poisoning by unspecified drugs, medicaments and biological substances, accidental (unintentional), initial encounter: Secondary | ICD-10-CM

## 2023-10-05 DIAGNOSIS — E876 Hypokalemia: Secondary | ICD-10-CM

## 2023-10-05 LAB — HCG, SERUM, QUALITATIVE: Preg, Serum: NEGATIVE

## 2023-10-05 LAB — COMPREHENSIVE METABOLIC PANEL WITH GFR
ALT: 12 U/L (ref 0–44)
AST: 27 U/L (ref 15–41)
Albumin: 4.5 g/dL (ref 3.5–5.0)
Alkaline Phosphatase: 68 U/L (ref 38–126)
Anion gap: 10 (ref 5–15)
BUN: 9 mg/dL (ref 6–20)
CO2: 27 mmol/L (ref 22–32)
Calcium: 8.4 mg/dL — ABNORMAL LOW (ref 8.9–10.3)
Chloride: 102 mmol/L (ref 98–111)
Creatinine, Ser: 0.72 mg/dL (ref 0.44–1.00)
GFR, Estimated: >60 mL/min (ref >60)
Glucose, Bld: 123 mg/dL — ABNORMAL HIGH (ref 70–99)
Potassium: 3.1 mmol/L — ABNORMAL LOW (ref 3.5–5.1)
Sodium: 139 mmol/L (ref 135–145)
Total Bilirubin: <0.2 mg/dL (ref 0.0–1.2)
Total Protein: 7 g/dL (ref 6.5–8.1)

## 2023-10-05 LAB — CBC WITH DIFFERENTIAL/PLATELET
Abs Immature Granulocytes: 0.03 10*3/uL (ref 0.00–0.07)
Basophils Absolute: 0 10*3/uL (ref 0.0–0.1)
Basophils Relative: 0 %
Eosinophils Absolute: 0.3 10*3/uL (ref 0.0–0.5)
Eosinophils Relative: 4 %
HCT: 33.7 % — ABNORMAL LOW (ref 36.0–46.0)
Hemoglobin: 11.5 g/dL — ABNORMAL LOW (ref 12.0–15.0)
Immature Granulocytes: 0 %
Lymphocytes Relative: 47 %
Lymphs Abs: 4.2 10*3/uL — ABNORMAL HIGH (ref 0.7–4.0)
MCH: 29.6 pg (ref 26.0–34.0)
MCHC: 34.1 g/dL (ref 30.0–36.0)
MCV: 86.9 fL (ref 80.0–100.0)
Monocytes Absolute: 0.6 10*3/uL (ref 0.1–1.0)
Monocytes Relative: 7 %
Neutro Abs: 3.7 10*3/uL (ref 1.7–7.7)
Neutrophils Relative %: 42 %
Platelets: 244 10*3/uL (ref 150–400)
RBC: 3.88 MIL/uL (ref 3.87–5.11)
RDW: 12.3 % (ref 11.5–15.5)
WBC: 8.9 10*3/uL (ref 4.0–10.5)
nRBC: 0 % (ref 0.0–0.2)

## 2023-10-05 LAB — URINE DRUG SCREEN
Amphetamines: NOT DETECTED
Barbiturates: NOT DETECTED
Benzodiazepines: NOT DETECTED
Cocaine: NOT DETECTED
Fentanyl: DETECTED — AB
Methadone Scn, Ur: NOT DETECTED
Opiates: NOT DETECTED
Tetrahydrocannabinol: DETECTED — AB

## 2023-10-05 LAB — ACETAMINOPHEN LEVEL: Acetaminophen (Tylenol), Serum: <10 ug/mL — ABNORMAL LOW (ref 10–30)

## 2023-10-05 LAB — SALICYLATE LEVEL: Salicylate Lvl: <7 mg/dL — ABNORMAL LOW (ref 7.0–30.0)

## 2023-10-05 LAB — ETHANOL: Alcohol, Ethyl (B): <15 mg/dL (ref <15)

## 2023-10-05 MED ORDER — SODIUM CHLORIDE 0.9 % IV BOLUS
1000.0000 mL | Freq: Once | INTRAVENOUS | Status: AC
  Administered 2023-10-05: 1000 mL via INTRAVENOUS

## 2023-10-05 MED ORDER — ONDANSETRON 4 MG PO TBDP: 4.0000 mg | ORAL_TABLET | Freq: Once | ORAL | Status: DC

## 2023-10-05 MED ORDER — ONDANSETRON 4 MG PO TBDP
8.0000 mg | ORAL_TABLET | Freq: Once | ORAL | Status: AC
  Administered 2023-10-05: 8 mg via ORAL
  Filled 2023-10-05: qty 2

## 2023-10-05 MED ORDER — POTASSIUM CHLORIDE CRYS ER 20 MEQ PO TBCR: 20.0000 meq | EXTENDED_RELEASE_TABLET | Freq: Two times a day (BID) | ORAL | 0 refills | Status: AC

## 2023-10-05 MED ORDER — POTASSIUM CHLORIDE CRYS ER 20 MEQ PO TBCR
40.0000 meq | EXTENDED_RELEASE_TABLET | Freq: Once | ORAL | Status: AC
  Administered 2023-10-05: 40 meq via ORAL
  Filled 2023-10-05: qty 2

## 2023-10-05 MED ORDER — PROMETHAZINE HCL 25 MG RE SUPP: 25.0000 mg | Freq: Four times a day (QID) | RECTAL | 0 refills | Status: AC | PRN

## 2023-10-05 NOTE — ED Triage Notes (Signed)
 Per pt sister, pt took a percocet appx 0100 and became unresponsive. Pt sister states that medics were called and gave pt CPR but not narcan  and directed them to come here. Pt is alert and oriented and ambulates independently

## 2023-10-05 NOTE — ED Notes (Signed)
 Attempted to bring pt back to room for triage, security in the lobby stated pt and accompanying party walked down the hallway to the vending machines.

## 2023-10-05 NOTE — ED Notes (Signed)
 Pt placed on bair hugger at this time.   Anastacio Balm, RN

## 2023-10-05 NOTE — ED Provider Notes (Addendum)
 [facility] Provider Note   CSN: [id number] Arrival date & time: 10/05/23  0546     History  Chief Complaint  Patient presents with   Drug Overdose    Gabrielle Rich is a 18 y.o. female.  The history is provided by the patient and a relative.  Drug Overdose This is a recurrent problem. The current episode started 3 to 5 hours ago. The problem occurs constantly. The problem has been rapidly improving. Nothing aggravates the symptoms. Nothing relieves the symptoms. She has tried nothing for the symptoms. The treatment provided significant relief.  Patient with a history of overdose and substance abuse used a percocet to get high and reportedly went unresponsive.  Family reports this pill was taken at 1 am.      Past Medical History:  Diagnosis Date   Depression    Substance abuse (HCC)      Home Medications Prior to Admission medications   Medication Sig Start Date End Date Taking? Authorizing Provider  medroxyPROGESTERone (DEPO-PROVERA) 150 MG/ML injection Inject 150 mg into the muscle every 3 (three) months. Patient not taking: Reported on 07/01/2023    [provider]  melatonin 5 MG TABS Take 0.5 tablets (2.5 mg total) by mouth at bedtime. 06/23/23   Tingling, Trevor Fudge, PA-C  naloxone  (NARCAN ) nasal spray 4 mg/0.1 mL Use for overdose of opioid 07/01/23   Viviano Ground, MD  sertraline  (ZOLOFT ) 25 MG tablet Take 1 tablet (25 mg total) by mouth daily. 06/24/23   Tingling, Trevor Fudge, PA-C  traZODone  (DESYREL ) 50 MG tablet Take 1 tablet (50 mg total) by mouth at bedtime. 06/23/23   Tingling, Trevor Fudge, PA-C      Allergies    Patient has no known allergies.    Review of Systems   Review of Systems  Unable to perform ROS: Age  Neurological:  Negative for speech difficulty.  Psychiatric/Behavioral:  Negative for self-injury and suicidal ideas.   All other systems reviewed and are negative.   Physical Exam Updated  Vital Signs BP (!) 145/98   Pulse 80   Temp (!) 95.6 F (35.3 C) (Rectal)   Resp 18   Ht 5' 3 (1.6 m)   Wt 49.9 kg   LMP 09/28/2023 (Exact Date)   BMI 19.49 kg/m  Physical Exam Vitals and nursing note reviewed. Exam conducted with a chaperone present.  Constitutional:      General: She is not in acute distress.    Appearance: She is well-developed.  HENT:     Head: Normocephalic and atraumatic.     Nose: Nose normal.  Eyes:     Pupils: Pupils are equal, round, and reactive to light.  Cardiovascular:     Rate and Rhythm: Normal rate and regular rhythm.     Pulses: Normal pulses.     Heart sounds: Normal heart sounds.  Pulmonary:     Effort: Pulmonary effort is normal. No respiratory distress.     Breath sounds: Normal breath sounds.  Abdominal:     General: Bowel sounds are normal. There is no distension.     Palpations: Abdomen is soft.     Tenderness: There is no abdominal tenderness. There is no guarding or rebound.  Musculoskeletal:        General: Normal range of motion.     Cervical back: Neck supple.  Skin:    General: Skin is warm and dry.     Capillary Refill: Capillary refill takes less than 2  seconds.     Findings: No erythema or rash.  Neurological:     General: No focal deficit present.     Mental Status: She is oriented to person, place, and time.     Deep Tendon Reflexes: Reflexes normal.  Psychiatric:        Mood and Affect: Mood normal.        Thought Content: Thought content does not include homicidal or suicidal ideation. Thought content does not include homicidal or suicidal plan.     ED Results / Procedures / Treatments   Labs (all labs ordered are listed, but only abnormal results are displayed) Results for orders placed or performed during the hospital encounter of 10/05/23  CBC with Differential   Collection Time: 10/05/23  6:13 AM  Result Value Ref Range   WBC 8.9 4.0 - 10.5 K/uL   RBC 3.88 3.87 - 5.11 MIL/uL   Hemoglobin 11.5 (L)  12.0 - 15.0 g/dL   HCT 81.1 (L) 91.4 - 78.2 %   MCV 86.9 80.0 - 100.0 fL   MCH 29.6 26.0 - 34.0 pg   MCHC 34.1 30.0 - 36.0 g/dL   RDW 95.6 21.3 - 08.6 %   Platelets 244 150 - 400 K/uL   nRBC 0.0 0.0 - 0.2 %   Neutrophils Relative % 42 %   Neutro Abs 3.7 1.7 - 7.7 K/uL   Lymphocytes Relative 47 %   Lymphs Abs 4.2 (H) 0.7 - 4.0 K/uL   Monocytes Relative 7 %   Monocytes Absolute 0.6 0.1 - 1.0 K/uL   Eosinophils Relative 4 %   Eosinophils Absolute 0.3 0.0 - 0.5 K/uL   Basophils Relative 0 %   Basophils Absolute 0.0 0.0 - 0.1 K/uL   WBC Morphology MORPHOLOGY UNREMARKABLE    Immature Granulocytes 0 %   Abs Immature Granulocytes 0.03 0.00 - 0.07 K/uL  Comprehensive metabolic panel   Collection Time: 10/05/23  6:13 AM  Result Value Ref Range   Sodium 139 135 - 145 mmol/L   Potassium 3.1 (L) 3.5 - 5.1 mmol/L   Chloride 102 98 - 111 mmol/L   CO2 27 22 - 32 mmol/L   Glucose, Bld 123 (H) 70 - 99 mg/dL   BUN 9 6 - 20 mg/dL   Creatinine, Ser 5.78 0.44 - 1.00 mg/dL   Calcium 8.4 (L) 8.9 - 10.3 mg/dL   Total Protein 7.0 6.5 - 8.1 g/dL   Albumin 4.5 3.5 - 5.0 g/dL   AST 27 15 - 41 U/L   ALT 12 0 - 44 U/L   Alkaline Phosphatase 68 38 - 126 U/L   Total Bilirubin <0.2 0.0 - 1.2 mg/dL   GFR, Estimated >46 >96 mL/min   Anion gap 10 5 - 15  Acetaminophen  level   Collection Time: 10/05/23  6:13 AM  Result Value Ref Range   Acetaminophen  (Tylenol ), Serum <10 (L) 10 - 30 ug/mL  Salicylate level   Collection Time: 10/05/23  6:13 AM  Result Value Ref Range   Salicylate Lvl <7.0 (L) 7.0 - 30.0 mg/dL  Ethanol   Collection Time: 10/05/23  6:13 AM  Result Value Ref Range   Alcohol, Ethyl (B) <15 <15 mg/dL  hCG, serum, qualitative   Collection Time: 10/05/23  6:23 AM  Result Value Ref Range   Preg, Serum NEGATIVE NEGATIVE   No results found.  EKG  EKG Interpretation Date/Time:  Wednesday October 05 2023 06:21:50 EDT Ventricular Rate:  57 PR Interval:  158 QRS Duration:  98 QT  Interval:  442 QTC Calculation: 431 R Axis:   86  Text Interpretation: Sinus rhythm Atrial premature complex Confirmed by Maralee Senate, Khalea Ventura ([id number]) on 10/05/2023 6:24:20 AM        Radiology No results found.  Procedures Procedures    Medications Ordered in ED Medications  ondansetron  (ZOFRAN -ODT) disintegrating tablet 8 mg (has no administration in time range)  sodium chloride  0.9 % bolus 1,000 mL (has no administration in time range)    ED Course/ Medical Decision Making/ A&P                                 Medical Decision Making Patient overdosed on 1 percocet reportedly   Amount and/or Complexity of Data Reviewed Independent Historian:     Details: Sister see above  External Data Reviewed: labs and notes.    Details: Previous notes and labs reviewed  Labs: ordered.    Details: Pregnancy is negative. Normal white count 8.9,slight low hemoglobin 11.5, normal platelets. Negative tylenol  level, negative salicylate level, negative ETOH level.  Normal sodium 139, slight low potassium 3.1, normal creatinine 0.72,  ECG/medicine tests: ordered. Decision-making details documented in ED Course.  Risk Prescription drug management.    Final Clinical Impression(s) / ED Diagnoses Final diagnoses:  None   Signed out to Dr. Isaiah Marc pending UDS and return to baseline Rx / DC Orders ED Discharge Orders     None          Evamae Rowen, MD 10/05/23 [phone]

## 2023-10-05 NOTE — ED Provider Notes (Signed)
   ED Course / MDM   Clinical Course as of 10/05/23 0940  Wed Oct 05, 2023  0703 Received sign out from Dr. Maralee Senate, pt with concern for accidental overdose, brought in by family. Pt ambulatory [WS]  [phone] Patient is awake and oriented. She wants to go home. Will discharge patient to home. All questions answered. Patient comfortable with plan of discharge. Return precautions discussed with patient and specified on the after visit summary.  [WS]    Clinical Course User Index [WS] Mordecai Applebaum, MD   Medical Decision Making Amount and/or Complexity of Data Reviewed Labs: ordered.  Risk Prescription drug management.        Mordecai Applebaum, MD 10/05/23 [phone]

## 2023-10-05 NOTE — ED Notes (Addendum)
 Called lab regarding UDS. Tech said that the analyzer is down but should be working soon. Per Dr. Isaiah Marc, okay to d/c before UDS results.   Anastacio Balm, RN
